# Patient Record
Sex: Male | Born: 1965 | Hispanic: Refuse to answer
Health system: Western US, Academic
[De-identification: ages and names within clinical notes are randomized; demographics above are authoritative.]

## PROBLEM LIST (undated history)

## (undated) DIAGNOSIS — T7840XA Allergy, unspecified, initial encounter: Secondary | ICD-10-CM

## (undated) HISTORY — DX: Allergy, unspecified, initial encounter: T78.40XA

## (undated) NOTE — ED Notes (Signed)
Formatting of this note might be different from the original.  Patient was given discharge instructions, verbalized knowledge and understanding of same.  Patient was discharged to home without complaint, appeared in no acute distress at this time.    Vitaly Mariana Arn, RN  Electronically signed by Ocie Cornfield, RN at 07/15/2023  1:46 PM PDT

## (undated) NOTE — Progress Notes (Signed)
 Formatting of this note might be different from the original.  HomeCare Document    Electronically signed by Shaune Leeks, MD at 08/10/2023 10:19 AM PDT

## (undated) NOTE — Telephone Encounter (Signed)
Signed Prescriptions: Disp Refills   methylphenidate HCl (RITALIN) 20 mg tablet 84 tab*0   Sig: Take 0.5-1 tablets (10-20 mg) by mouth 2 to 3 times   daily FOR DEPRESSION (LIMIT USE AS TOLERATED), TO   LAST 28 days  Authorizing Provider: Stevphen Rochester    -------------------------------------------------------------------    Electronically signed by Stevphen Rochester, MD at 05/19/2023  6:41 AM PDT

## (undated) NOTE — Progress Notes (Signed)
Formatting of this note is different from the original.  Images from the original note were not included.  RN FOLLOW UP  -  CHRONIC DISEASE MANAGEMENT    [x]  CDM Rn confirmed patient full name and date of birth.     HISTORY  Carlos Hancock is a 63 year old male who has type 2 diabetes.    Goals and targets documented in RN assessment.    Summary of Plan from last contact:   CDM Rn has asked patient to begin checking fasting, pre-dinner and before bed.      Lab Results   Component Value Date    A1C 8.1 (H) 02/23/2023    A1C 8.2 (H) 12/11/2022    A1C 9.0 (H) 08/22/2022     Lab Results   Component Value Date    GFR 100 10/20/2022     Nursing Assessment:  Any items in red indicated areas of concern and education was provided unless otherwise noted in the plan.     Prescribed DM Orals:   metFORMIN (GLUCOPHAGE XR) 500 mg extended release (24 hr) tablet - Take 4 tablets (2,000 mg) by mouth daily  empagliflozin (JARDIANCE) 25 mg tablet - Take 0.5 tablets (12.5 mg) by mouth every morning     Prescribed DM Non-insulin Injectables: N/A    Prescribed Insulin:       Insulin Type Basal Dose  Morning Basal Dose  Evening/HS Estimated Total Daily Dose  (All basal & all bolus doses)   Prescribed Doses Semglee  Vial 30 units  - 30 units        Mealtime (bolus) insulin:  Base dose or Carb ratio (ICR): N/A   Sliding Correction Scale (ISF): N/A     Titration parameters include: RN confirms correct titration parameters/medications are ordered in CDM Referral including Nursing Diabetes Medication Management of Type 2 Standing Order     Concerns about medication or insulin adherence: No    Monitoring:   Patient is synced with [employer] and patients blood glucose data is pasted below     Glucose concerns:    Yes; hyperglycemia    Patient is above glucose goal(s) fasting, pre-dinner and before bed.     Glucose Goals:  Fasting: 80-130 mg/dL  Pre-Meals: 57-846 mg/dL  Before Bed: <962 mg/dL   2 Hours After Eating: <180 mg/dL   Overall Glucose  Average Goal: 80-150 mg/dL     CDM Rn to increase morning dose of Semglee to hyperglycemia.    CDM Rn discussed the pattern of prandial rise between meals and explained that this is most often directly related to nutritional intake. CDM Rn explained that there are a few options we may consider right now for addressing this:    Working on carbohydrate moderation with dinner and after dinner snack combined with increasing protein and vegetable intake in attempt to reduce prandial rise.  Adding in a mealtime insulin called Humalog that will provide insulin coverage for food in attempt to reduce prandial rise.    CDM Rn explained that neither choice is "better" than the other and it ultimately comes down to what is best for the patient. Additional information sent to patient through secure message.    Patient asks if switching Metformin from bedtime to morning could be causing elevated morning numbers. CDM Rn explained that because patient is on XR formula this is unlikely and that elevated morning numbers are more likely the result of having discontinued evening dose Semglee.    Blood pressure:  Current Medications:   atorvastatin (LIPITOR) 20 mg tablet - Take 1 tablet (20 mg) by mouth daily   Blood Pressure Clinic   02/11/2023 152/99    149/99   12/11/2022 152/82   09/10/2022 142/80    150/84   07/10/2022 140/90   03/24/2022 146/90       Patient in target:  yes  Is RN managing HTN: RN will not manage patients BP, it will continue to be managed by PCP    Reducing Risks:   Health Maintenance Due   Topic Date Due    Vaccine: COVID-19 (1) Never done    Vaccine: Shingles (1 of 2) Never done    Blood Pressure Check  02/12/2023    CRCS: FIT (Home stool test)  04/04/2023    Depression F/U: [facility] Monitoring Tool  07/24/2023         03/26/2021     4:00 PM 02/07/2022     1:00 PM 03/25/2022     1:00 PM 05/26/2022     5:29 PM 09/10/2022    10:12 AM 12/11/2022     2:00 PM 03/24/2023     9:00 AM   BHI Summary   PHQ-9 Score 15 11 12  10    10 5 9 7    PHQ-9 Score (MyGH)    10 5     GAD-2 Score 4 2  2    2 2 2 2    Audit-C Score (Monitoring Tool) 0 0  0    0 0 0 0   Audit-C Score (BHI Screen)   0       Audit-C Score (MyGH)    0 0     Marijuana Use (Monitoring Tool) 0 0  0 0 0 0   Marijuana Use (BHI Screen)   0       Marijuana Use [facility])    Never Never     Drug Use (Monitoring Tool) 0 0  0 0 0 0   Drug Use (BHI Screen)   0       Drug Use [facility])    Never Never     Access to guns? (Monitoring Tool) 1 1  1 1 1 1    Access to guns? [facility])    Yes Yes         Social History     Tobacco Use    Smoking status: Never    Smokeless tobacco: Never   Substance Use Topics    Alcohol use: No    Drug use: No       PLAN:  Updated insulin prescriptions today:  yes      CDM Rn Actions:  Update patient prescription(s) in Epic    Patient Actions:  We are increasing your morning dose of Semglee from 30 units to 35 units.  This is a 18% increase and is being done to target daily hyperglycemia.         Insulin Type Basal Dose  Morning Basal Dose  Evening/HS Estimated Total Daily Dose  (All basal & all bolus doses)   Prescribed Doses Semglee  Vial 35 units  - 35 units        Our next phone visit is scheduled for: 05/26/23 at 10:00am    Amy Hanson-Murillo  Diabetes Nurse  Population Nursing Management    Electronically signed by Salena Saner, RN at 05/05/2023 11:10 AM PDT
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
## (undated) NOTE — Progress Notes (Signed)
Formatting of this note might be different from the original.  Hi Dr. Raleigh Callas:    Will you please reply "I agree" if you approve of my implementing the Population Care Nursing and Diabetes standing order?    Nursing Diabetes Medication Management of Type 2 Standing Order    Thank you!    Amy Hanson-Murillo  Diabetes Nurse  Population Nursing Management   Electronically signed by Salena Saner, RN at 06/10/2023  1:04 PM PDT

## (undated) NOTE — Progress Notes (Signed)
Formatting of this note is different from the original.  Provider needs to reconcile: Medications     Patient's main concern: Hand Problem     Other needs or issues hoping to address?   A1C Blood Test    Care reminders were reviewed:  Yes:  Flu    Chaperone for Sensitive Physical Exams (In Office):  Not Applicable    Orders Placed This Encounter    ibuprofen (MOTRIN) 600 mg tablet    acetaminophen (TYLENOL) 500 mg tablet     eCheck-In Action/Status       eCheck-in Steps Patient Action Taken Status    Personal Information  Not Started    Payments  Not Needed    ESign Documents  Not Offered    Questionnaires  Not Started    Medications  Not Started    Allergies  Not Started    Barcode  Not Started         Assigned Appointment Questionnaires and Status       Assigned Questionnaires Status    REASON FOR VISIT SELECTION [facility]) Assigned           Medications reviewed:       Thu Dec 11, 2022  1:20 PM       Allergies reviewed:         Dec 11, 2022  1:17 PM       Questionnaires completed: No questionnaires completed  Smoking Tobacco Use: Never    Marcellus Scott, [place]  12/11/2022, 1:21 PM    Electronically signed by Marcellus Scott, MA at 12/11/2022  3:26 PM PST

## (undated) NOTE — Progress Notes (Signed)
 Formatting of this note might be different from the original.  HomeCare Document    Electronically signed by Shaune Leeks, MD at 08/05/2023  1:19 PM PDT

## (undated) NOTE — Progress Notes (Signed)
Formatting of this note might be different from the original.  Attempted to contact the patient via phone to complete pre-visit prep. Left message with appointment reminder, if active on My Chart please sign-in to your [facility] Chart and complete your eCheck-in, you do not need to call us back.    Electronically signed by Marshell Levan, [place] at 10/08/2023  1:07 PM PDT
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
## (undated) NOTE — Progress Notes (Signed)
Formatting of this note might be different from the original.  Called & briefly spoke w/patient - this visit was schedule as PV. Plan was to do VV for assessment of cervical spine. VV scheduled for next week due to time constraints today.   Electronically signed by Aron Baba, PT at 06/02/2023 11:21 AM PDT

## (undated) NOTE — Assessment & Plan Note (Signed)
Associated Problem(s): Type 2 diabetes mellitus with hyperglycemia, with long-term current use of insulin  Formatting of this note might be different from the original.  Recent A1C of 8.4, up from 8.1-8.2.   Currently on 45 units of long-acting insulin, metformin, and Jardiance. Morning blood sugars between 120-150 as per pt.  -Continue current regimen.   He was on short acting insulin in the hospital and has restarted his oral meds.   -Check A1C in 1 month to assess need for short-acting insulin.    Electronically signed by Shaune Leeks, MD at 07/07/2023 12:47 PM PDT

## (undated) NOTE — Progress Notes (Signed)
Formatting of this note might be different from the original.  Attempted to contact the patient via phone to complete pre-visit prep. SM sent with reminder to complete E-CHECK IN.    Electronically signed by Marshell Levan, [place] at 07/07/2023 12:47 PM PDT

## (undated) NOTE — Progress Notes (Signed)
Formatting of this note might be different from the original.  Please see transcribed notes for additional visit information.    Electronically signed by Jerolyn Center, MA at 11/16/2009  8:10 AM PST

## (undated) NOTE — Progress Notes (Signed)
Formatting of this note might be different from the original.  Please see transcribed notes for additional visit information.    Electronically signed by Mancel Parsons, LPN at 16/60/6301  9:04 AM PDT

## (undated) NOTE — Progress Notes (Signed)
Formatting of this note is different from the original.  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 He is s/p C4/5 cervical fusion in the 1994. He has trialed all of typical medication classes for his pain complaints including SNRI's (currently on duloxetine), gabapentinoids (on gabapentin), TCA's (nortriptyline), and sodium channel blockers (carbamazepine, lamotrigine) and I do not recommend retrials of these medications at this time.  We could consider increasing his duloxetine to 90 mg per day however I defer this to his mental health provider, Dr. Lavella Hammock. I will reach out to her for her input.  Other potential medication options include low-dose naltrexone and memantine.  However, these medications are typically prescribed for CNS sensitized disease states like chronic regional pain syndrome and fibromyalgia.  His last central sensitization inventory was calculated in 03/2021 which was indicative of mild central sensitization. I do not believe that trials of low-dose naltrexone or memantine would be beneficial in this setting.  His current opioid dose is considered high (>90 mg). When this is taken into consideration along with his underlying medical comorbidities, they are at moderate-to-high risk for opioid related morbidity and mortality.  He is not shown any signs or symptoms of opioid related toxicities up to this point.  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 He receives significant functional benefit from his opioids. Based on his ORT score of 1, the patient is at low risk for opioid related aberrant behaviors. Based previous comprehensive evaluations, I do not believe that the patient is misusing his opioids and uses them appropriately for functional benefit.      He continues to  tolerate his current opioid regimen well without any side-effects or safety concerns. In a shared decision, will continue oxycodone 15 mg max of 5.5 tabs/day with close monitoring for worsening pain/reduced function.     PHARMACOTHERAPY RECOMMENDATIONS:  -Continue Oxycontin 10 mg BID, oxycodone 15 mg to a max of 5.5 tabs/day  -Phone PharmD follow up in 4 weeks.   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 He is s/p C4/5 cervical fusion in the 1994.     Last Pain PharmD Evaluation on 04/27/2023:  He continues to tolerate his current opioid regimen well without any side-effects or safety concerns. In a shared decision, will reduce his oxycodone 15 mg to a max of 5.5 tabs/day with close monitoring for worsening pain/reduced function.     Interval History:   The patient's primary pain complaint continues to located in his neck, bilateral shoulders, bilateral feet, RUE  He continues on Oxycontin 10 mg to BID and reduced oxycodone 15 mg to a max of 5.5 tabs/day without any side-effects or safety concerns. He reports worsening pain.     Current Pain/Psychoactive Medication Regimen:  Medication How it is prescribed Reported use   Oxycodone ER 10mg  1 tab BID  As prescribed   Oxycodone 15 mg 1 tab Q4H PRN  As prescribed (average 5.5 tabs/day)   Ibuprofen 200   OTC Intermittent use (about once/week)   The patients current MED is 153.75 mg  Allergies   Allergen Reactions    Adhesive Tape Rash and Dermatitis     Welts, skin tear    Naproxen Swelling     Renal:  Lab Results   Component Value Date    CRE 1.42 04/22/2021     Lab Results   Component Value Date    GFR 58 04/22/2021    GFR above 60 03/04/2021     Hepatic:  Lab Results   Component Value Date    ALT 18 04/22/2021     Lab Results   Component Value Date    AST  10 04/22/2021     Opioid risk assessment:  Opioid Risk Stratification: high  RIOSORD score: 34 (history of migraine, prescribed long acting opioid formulation, prescribed benzodiazepine, prescribed antidepressant ), which correlates to average predicted probability 55.1 % of opioid-induced respiratory depression during the next 6 months. (Zedler et al, 2018). The RIOSORD is the Risk Index for Overdose or Serious Opioid-Induced Respiratory Depression tool for identifying patients at risk for opioid induced respiratory depression. It has been validated in both [place] Veterans and in the general population and can be used to decide which patients may benefit from take home naloxone. Noted patient was prescribed naloxone and dispensed on (03/22/2020)  UDS (02/11/2023): Consistent with prescribed regimen.   [place] PDMP (1-year trend) : Appropriate  Medical co-morbidity risks:history of migraine, history of recurrent major depression resistant to treatment, history of TIA.    Izora Gala, PharmD  Clinical Pharmacy Specialist - Pain Management    Electronically signed by Jonny Ruiz, PharmD at 05/25/2023 11:51 AM PDT
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
## (undated) NOTE — Discharge Summary (Signed)
 Formatting of this note might be different from the original.  HOME HEALTH DISCHARGE SUMMARY    RECOMMENDED FOLLOW UP PLAN / FUTURE APPT: recommend outpatient PT and referral  to physiatry  CURRENT LEVEL OF FUNCTION / SOCIAL SUPPORT: pt resides in own 2 level home with  teenage children. pt is indep with all adls and mobility, using a cane for  stairs and outside ambulation. pt continues to have pain in multiple sites,  bilat shoulders, L>R, L knee, sternum and ribs. pt is awaiting some diagnostic  imaging regarding these areas. pt is able to manage and drive and therefor is  no longer homebound. ready for HHPT DC    MOST RECENT STANDARD TESTING RESULT (if appropriate) - NA    DISCHARGE OUTCOMES -   1. Indep ambulation and stair management.-met  2. Improve general mobility with TUG test score of <17 sec.-NA  3. Complete safe DC planning. -met    SIGNIFICANT BARRIERS pain  Electronically signed by Buddy Duty, PT at 08/19/2023  9:38 AM PDT
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
## (undated) NOTE — Progress Notes (Signed)
Formatting of this note is different from the original.  Medicare Part D - Medication Therapy Management Program:  Comprehensive Medication Review - [facility]    ** Please review the following recommendation(s) and reply. If you agree, please state agreement (i.e. I agree with plan) with any additional comments and route back to clinical pharmacist Pedro Earls, PharmD). Clinical Pharmacist to implement recommendation(s)**    ACTION ITEM(S) FOR CONSIDERATION   None: No Recommendation    CLINICAL PHARMACIST REVIEW     Verified and updated allergies/intolerances?:  No    Drug-Drug Interaction review completed by pharmacist: Yes    Patient medication concerns:   Needs refills: testosterone, atorvastatin, Jardiance, insulin syringes, lamotrigine  Routed request to mail order pharmacy  Needs OTC medications, but is cost sensitive. Was not aware of his OTC benefit through Medicare.  Sent secure message to pt with instructions.  Pt states that doctor at hospital was concerned about over basalization with glargine insulin.  Advised pt to speak with population RN, but to also hold off on diabetes regimen adjustments until he has stabilized some.    Medication care gaps:  Identified medication(s) on [employer] Preferred / [employer] Initiatives:  no  Identified medication(s) on High Risk Medication in the elderly list:  not applicable  On statin for ASCVD/diabetes: Yes    Prescription Days Covered (PDC) review findings:  Adherent: PDC > 80%    Adherence:  Many people find it challenging to take medication as prescribed. Which medications have you had missed doses or stopped taking, perhaps because you didn't think it was working or because of how it made you feel? None  What system is in place for remembering which medications to take and when? (If no system in place or the current one is not working, discuss options to prevent adherence issues) Part of his routine, keeping well up to date with current meds and recent changes from hospital  stay.    Labs/vitals:    CREATININE   Date Value Ref Range Status   10/20/2022 0.90 0.50 - 1.30 mg/dL Final   16/09/9603 5.40 0.50 - 1.30 mg/dL Final   98/10/9146 8.29 0.50 - 1.30 mg/dL Final     Lab Results   Component Value Date    GFR 100 10/20/2022    GFR 88 08/22/2022    GFR 88 02/07/2022     BUN   Date Value Ref Range Status   10/20/2022 15 6 - 24 mg/dL Final   56/21/3086 14 6 - 24 mg/dL Final   57/84/6962 15 6 - 24 mg/dL Final     SODIUM   Date Value Ref Range Status   10/20/2022 141 135 - 145 MEQ/L Final   08/22/2022 138 135 - 145 MEQ/L Final   02/07/2022 140 135 - 145 MEQ/L Final     POTASSIUM   Date Value Ref Range Status   10/20/2022 4.3 3.5 - 5.2 MEQ/L Final   08/22/2022 4.2 3.5 - 5.2 MEQ/L Final   02/07/2022 5.1 3.5 - 5.2 MEQ/L Final     AST   Date Value Ref Range Status   10/20/2022 12 6 - 35 U/L Final   02/07/2022 11 6 - 35 U/L Final   04/22/2021 10 6 - 35 U/L Final     ALT   Date Value Ref Range Status   10/20/2022 29 5 - 50 U/L Final   02/07/2022 20 5 - 50 U/L Final   04/22/2021 18 5 - 50 U/L Final  Lab Results   Component Value Date    CHOL 142 03/24/2022    CHOL 163 01/28/2021    CHOL 101 05/11/2019     Lab Results   Component Value Date    HDL 47 03/24/2022    HDL 49 01/28/2021    HDL 38 (L) 05/11/2019     Lab Results   Component Value Date    LDL 52 03/24/2022    LDL 56 01/28/2021    LDL 35 05/11/2019     Lab Results   Component Value Date    TRIG 217 03/24/2022    TRIG 291 (H) 01/28/2021    TRIG 141 05/11/2019     Patient Reported BP:       02/11/2023    11:04 AM 02/11/2023    10:59 AM 12/11/2022     1:21 PM 09/10/2022    10:08 AM 09/10/2022    10:05 AM 07/10/2022    10:45 AM 03/24/2022    11:16 AM   Blood Pressure   Clinic BP 152/99 149/99 152/82 142/80 150/84 140/90 146/90     Blood Pressure Clinic   02/11/2023 152/99    149/99   12/11/2022 152/82   09/10/2022 142/80    150/84   07/10/2022 140/90   03/24/2022 146/90       Pulse Readings from Last 3 Encounters:   02/11/23 115   12/11/22 106   09/10/22  98     The 10-year ASCVD risk score (Arnett DK, et al., 2019) is: 12.3%    Values used to calculate the score:      Age: 82 years      Sex: Male      Is Non-Hispanic African American: No      Diabetic: Yes      Tobacco smoker: No      Systolic Blood Pressure: 152 mmHg      Is BP treated: No      HDL Cholesterol: 47 mg/dL      Total Cholesterol: 142 mg/dL    Health Maintenance alerts:  Health Maintenance Due   Topic Date Due    Vaccine: COVID-19 (1) Never done    Vaccine: Shingles (1 of 2) Never done    Blood Pressure Check  02/12/2023    CRCS: FIT (Home stool test)  04/04/2023    COT CARE PLAN UPDATE  06/30/2023    DM Foot Exam  07/11/2023    Depression F/U: [facility] Monitoring Tool  07/24/2023     Next scheduled appointment:     Future Appointments   Date Time Provider Department Center   07/01/2023  9:00 AM Phibbs, Tomie China, MD [facility] CONSULTATION [address]   07/06/2023 10:50 AM Shaune Leeks, MD [facility] [place]   07/07/2023 12:40 PM Salena Saner, RN POPULATION NURSE MANAGEMENT [place]   07/13/2023  2:50 PM Shaune Leeks, MD [facility] [place]   08/20/2023 10:00 AM Jonny Ruiz, PharmD [facility] PA[facility] CARE CONSULTATION [address]     PATIENT ACTION PLAN   1. What we talked about: Taking medication as prescribed- It sounds like you have a great system in place for keeping up to date with your medications and the recent changes after being in the hospital.  What I need to do: Keep up the great work!     2. What we talked about: Over-the-counter product(s)- Medicare may cover some of your OTC products.  What I need to do: Visit [email or web address] to  see an Stage manager and order products. You may also have a catalog sent to you and place an order by calling [phone] (Monday through Friday, 7AM to 5PM).     3. What we talked about: Medication change(s)- You noted that the doctor at the hospital raised concern about your insulin regimen and that there may be  opportunity to optimize your regimen with a mealtime insulin.  What I need to do: Please follow-up with your Population Health Nurse to discuss possible changes to your regimen. It may be worthwhile to wait a few weeks as you recover and then make changes together.     4. What we talked about: Reminder to refill or pickup medication- You reported needing 5 medications refilled at the pharmacy. I routed a message to the pharmacy to get these coordinated for you. What I need to do: Please keep an eye out for these to arrive soon.     CMR recipient requests post-CMR letter to be delivered via -: Mail   Patient's address verified: Yes  Name of CMR recipient: Masao Junker  [address]  [place] [id number]    Pedro Earls, PharmD  Clinical Pharmacist   -------------------------------------------------------------------------------------------------------------------  Administration information for Medication Therapy Management  Comprehensive Medication Review was offered to, and accepted by the patient on 06/30/2023.  Method of delivery: Phone  Recipient: Beneficiary  Provider: Plan Sponsor Pharmacist          Electronically signed by Pedro Earls, PharmD at 07/02/2023 11:09 AM PDT
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
## (undated) NOTE — Telephone Encounter (Signed)
Pending Prescriptions: Disp Refills   methylphenidate HCl (RITALIN) 20 mg tablet 84 tab*0   Sig: Take 0.5-1 tablets (10-20 mg) by mouth 2 to 3 times   daily FOR DEPRESSION (LIMIT USE AS TOLERATED), TO   LAST 28 days    -------------------------------------------------------------------    Electronically signed by Lovell Sheehan, PhT at 05/18/2023  5:59 AM PDT

## (undated) NOTE — Progress Notes (Signed)
 Formatting of this note might be different from the original.  HomeCare Document    Electronically signed by Shaune Leeks, MD at 07/07/2023  3:01 PM PDT

## (undated) NOTE — Assessment & Plan Note (Signed)
Associated Problem(s): Controlled type 2 diabetes mellitus with diabetic neuropathy, with long-term current use of insulin  Formatting of this note might be different from the original.  Improved A1C, currently managed with insulin and oral medications.  -Continue current regimen.  -Order A1C in three months.    Electronically signed by Shaune Leeks, MD at 08/30/2023  4:30 PM PDT
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
## (undated) NOTE — Progress Notes (Signed)
Formatting of this note might be different from the original.  Carlos Hancock seen in clinic following elevated blood pressure reading in lobby, name and DOB verified.  Lobby BP: 172/174mmHg HR 125bpm    BP in clinic: 149/94mmHg HR 117bpm  Rechecked ~8 minutes later: 152/39mmHg HR 115bpm  Of note elevated HR not new for patient as he is on Ritalin.    Patients repeat BP was elevated today >140/90. Pt screened for symptoms below.     Does the patient have any cardiac related symptoms? (Shortness of breath or chest, neck, jaw, shoulder or arm pain or discomfort) No  Lightheadedness? No  Blurry Vision? No  Headache? No  New Onset of difficulty standing/walking? No  New Onset weakness of face/unilateral extremity, confusion, decreased mental status?  No  Currently pregnant or within 12 weeks post partum? N/A  Did the patient answer yes to any above question or their BP is >180/110? No    Patient feels his elevated BP may be related to being in clinic feeling nervous. He was advised to purchase Omron at home cuff to determine if BP remains elevated. CU-10 visit scheduled 02/20/23 with PCP and [name] will purchase blood pressure cuff and log readings for at least 5 days twice daily. He was instructed to send PCP secure message of log to help with decision making around prescribing and voiced agreement. Also informed that CU-10 slot will be focused specifically on blood pressure medication.    He is aware of s/s warranting higher level of care such as UC/ED and will follow up should concerns arise.    Electronically signed by Wayland Denis, RN at 02/11/2023 11:20 AM PST

## (undated) NOTE — Telephone Encounter (Signed)
Formatting of this note might be different from the original.  Patient called again in regards to his pain medications.  Last week mail order did not process it in time that the patient would receive it in time for his 1030am dose. I had called to have his prescriptions changed to the [facility] pharmacy but they did not have the stock.  Today patient called again as [facility] would again have to order his meds and they would not be available in time.  I called [facility] pharmacy and asked if they had the stock for his medications and they did.  They will call [facility] to transfer the rx's.  Pt notified.  Electronically signed by Nolon Stalls L at 07/17/2023 11:39 AM PDT

## (undated) NOTE — Progress Notes (Signed)
Formatting of this note is different from the original.  Images from the original note were not included.   [facility] VIDEO Visit  09/30/22    Due to emergent measures put in place by our national and state government to help stop the spread of the COVID-19 pandemic crisis, and to protect this patient from the possible risk of transmission in a clinic setting, this virtual visit was conducted in place of an office visit.  Patient was identified with two identifiers.  Patient is located at in parking at [facility] in his car and the practitioner is located in [facility] clinic.           Appointment Notes       COT visit          Primary Care Manager  Shaune Leeks, MD    CHIEF COMPLAINT    chief complaint:  COT Visit    Subjective:   Carlos Hancock is a 66 year old male who presents for chronic opioid therapy management for the treatment of Neck pain  Shoulder pain.  chronic neck and shoulder pain    Symptoms are worse in the winter.   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     Functional goal: Improve walking ability, Improve sleep, and Improve ability to engage in activities (cook, clean, garden, etc.)   Achieved functional goals:  Yes.  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  Patient perceived benefits of COT are: Improve relationships  Improve ability to engage in activities (cook, clean, garden, etc.)  Current opioid side effects: None  Last dose of opiate medication: 6 hours - is overdue for a dose, but has to drive home after picking up antibiotics today  Additional concerns for today: No    Current Opioid Medications and MEDD:  Outpatient Morphine Milligram Equivalents Per Day        09/30/22 - 10/13/22 180 MME/Day      Order Name Dose Route Frequency Maximum MME/Day     oxyCODONE (OXYCONTIN) 10 mg extended release (12 hr) tablet 10 mg Oral 3 Times Daily 45 MME/Day     oxyCODONE (ROXICODONE) 15 mg tablet 15 mg Oral Every 4  Hours PRN 135 MME/Day    Total Potential Morphine Milligram Equivalents Per Day 180 MME/Day      Calculation Information         oxyCODONE (OXYCONTIN) 10 mg extended release (12 hr) tablet    oxyCODONE 10 mg Tr12: single dose of 10 mg * 3 doses per day * morphine equivalence factor of 1.5 = 45 MME/Day      oxyCODONE (ROXICODONE) 15 mg tablet    oxyCODONE 15 mg Tab: single dose of 15 mg * 6 doses per day * morphine equivalence factor of 1.5 = 135 MME/Day                      10/14/22 and after None               Current Non-Opioid Pain Medications:  ibuprofen (for dental surgery)  Naloxone last ordered on 07/21/2022    PEG (required):  Clinically meaningful improvement in function is defined as >= 30% reduction in PEG score as compared to start of treatment or response to dose change 05/26/2022 05/26/2022 09/30/2022   Pain in last month (0-10): (Wellness) - - -   Pain interference with daily activities (0-10): (Wellness) - - -  Health in general: (Wellness) - - -   Pain 7 7 8    Quality of Life 8 8 5    Function 8 8 4    PEG Score 7.67 7.67 5.67   Pain ([facility]) - 7 -   Quality of Life [facility]) - 8 -   Function [facility]) - 8 -   PEG Score [facility]) - 7.67 -       Improvements in PEG he links to doing better with depression and his participation in coaching middle school  Sports    Safety Checks:  Last Care Plan Update: 05/27/2022  [place]MP Review:   [employer] PMP Findings: consistent with prescribed medications   UDS:   OPIATES   Date Value Ref Range Status   08/22/2022 NONE DETECTED CUT-OFF: 300 ng/mL Final     Patient expected UDS findings: for prescribed medications (next due in December)    Screening Questionnaires:  ORT:   Opioid Risk Tool (ORT) 02/29/2016 03/26/2021   FamHx: Alcohol - 0   FamHx: Illegal Drugs - 0   FamHx: Rx Drugs - 0   Personal Hx: Alcohol - 0   Personal Hx: Illegal Drugs - 0   Personal Hx: Rx Drugs - 0   Between 75 and 21 years old? - 0   Psych: ADHD/ADD, OCD, Bipolar, Schizophrenia - 0   Psych: Depression 1 0    Risk score - 0   Risk category - Low     BHI Summary:   BHI Summary 03/18/2021 03/26/2021 02/07/2022 03/25/2022 05/26/2022 05/26/2022 09/10/2022   PHQ-9 Score 15 15 11 12 10 10 5    PHQ-9 Group Score - - - - - - -   PHQ-9 Score ([facility]) 15 - - - - 10 5   GAD-2 Score 5 4 2  - 2 2 2    GAD-2 Group Score - - - - - - -   Audit-C Score (Monitoring Tool) 0 0 0 - 0 0 0   Audit-C Score (BHI Screen) - - - 0 - - -   Audit-C Score ([facility]) 0 - - - - 0 0   Marijuana Use (Monitoring Tool) 0 0 0 - 0 0 0   Marijuana Use (BHI Screen) - - - 0 - - -   Marijuana Use ([facility]) Never - - - - Never Never   Drug Use (Monitoring Tool) 0 0 0 - 0 0 0   Drug Use (BHI Screen) - - - 0 - - -   Drug Use [facility]) Never - - - - Never Never   Access to guns? (Monitoring Tool) 1 1 1  - 1 1 1    Access to guns? [facility]) Yes - - - - Yes Yes     Risk Level:   Patient's specific condition risk factors are:  Mental Health             Noted    Recurrent major depression resistant to treatment 05/27/2013    Overview     Last F2F visit with Dr. Lavella Hammock 11/01/18.    Previous psychiatrist Dr. 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    Overview     Incident To KP last  OV date:  01/01/21 for testosterone therapy  Specialist in charge: Seaver  Testosterone cypionate 150mg  every week          Aberrant behaviors: none  System calculated risk: High Intensity  Provider override risk:      Active Problems/Past Medical History:  Patient Active Problem List   Diagnosis    Right shoulder pain    Cervical vertebral fusion    Personal history of TIA (transient ischemic attack)    Sleep disorder    Ocular migraine    CHRONIC OPIOID THERAPY CARE PLAN    Deviated nasal septum    Type 2 diabetes mellitus with peripheral neuropathy    Encounter for long-term (current) use of insulin    Recurrent major depression resistant to treatment    Hypogonadotropic  hypogonadism    Personal history of COVID-19    Long COVID    Type 2 diabetes mellitus with hyperglycemia, with long-term current use of insulin    Chronic pain     Medications/Allergies:  No Active Medications on file as of 09/30/2022.    Allergies   Allergen Reactions    Adhesive Tape Rash and Dermatitis     Welts, skin tear    Naproxen Swelling     Social History:  Social History     Tobacco Use    Smoking status: Never    Smokeless tobacco: Never   Substance Use Topics    Alcohol use: No       Physical Exam: (Video)  Gen: Alert, oriented.  Pleasant.  Psych: Appearance: well groomed  Behavior: No psychomotor retardation or agitation  Mood: Euthymic  Affect: Appropriate and slightly blunted/restricted  Speech: Normal rate, volume, tone (softer tone due to recent oral surgery and mouth pain)  Thought: No SI/HI/Psychosis  Cognition: Grossly intact.  Suicidal ideations: none   Skin: no rashes lesions appreciated on visible skin at available video quality    A/P:     (M25.511) Right shoulder pain  (primary encounter diagnosis)    (G89.29) Chronic pain    (F11.90) Chronic, continuous use of opioids  Comment: tapering gradually with Kandee Keen due to high MED  - PEGs are improving  Plan: continue current tapering plan with PharmD    Opioid Medication Plan:  Taper:  Medication being tapered: Oxycodone 15 mg and Oxycodone ER 10 mg  Rate of taper: Slowest (ie many years, high risk but no evidence of current problems): 2% every 4 weeks (follow up monthly)  Chronic Opioid Therapy (COT) Safety Guideline - [place]   Clinician managing taper: Shaune Leeks, MD  Is patient managed by [facility](Clinical Pharmacist)?: Yes - Jonny Ruiz, PharmD    Non-Medication Treatment Plan:  1. Movement and body awareness (first line for all chronic pain: walking and other low impact activity  2. Pain management skill building:  would like to do massage therapy but it is not covered by Medicare insurance  3. Complementary/integrative treatment:  none  4. Other: heat and ice    Risks of opioids for all patients are dependence, opioid use disorder, respiratory depression, cognitive effects and overdose. Patient and I decided that the benefits of opioids outweigh the risks for their individual needs.      Patient is given written updated opioid care plan AVS - printed and mailed today    Follow-up in 3 months with office or video visit with me, for COT visit and with usual follow-ups with Dr. Julio Alm PharmD    UDS monitoring will be based  on risk level as above per KP guidelines.     This Video Visit consisted of 20 minutes of interaction with the patient.       The patient or representative indicates understanding of these issues and agrees with the plan.     Tomie China Phibbs, MD    NOTE TO PATIENT: The 21st Century Cures Act makes medical notes like these available to patients in the interest of transparency. However, be advised this is a medical document. It is intended as peer to peer communication. It is written in medical language and may contain abbreviations or verbiage that are unfamiliar. It may appear blunt or direct. Medical documents are intended to carry relevant information, facts as evident, and the clinical opinion of the practitioner(s).  Electronically signed by Phibbs, Tomie China, MD at 09/30/2022 11:01 AM PDT

## (undated) NOTE — Assessment & Plan Note (Signed)
Associated Problem(s): Right shoulder pain  Formatting of this note might be different from the original.  Chronic right shoulder pain and knee pain.   Ortho already ordered MRI. Scheduled for 10/20  Advised follow up with ortho once MRI results available  Electronically signed by Shaune Leeks, MD at 10/08/2023  1:04 PM PDT

## (undated) NOTE — Progress Notes (Signed)
 Formatting of this note might be different from the original.  HomeCare Document    Electronically signed by Shaune Leeks, MD at 08/05/2023  4:54 PM PDT

## (undated) NOTE — Assessment & Plan Note (Signed)
 Associated Problem(s): Cervical radiculopathy  Formatting of this note might be different from the original.  New onset cervical radiculopathy S/p recent CVA  History of cervical fusion at C4-5  [facility].   Electronically signed by Shaune Leeks, MD at 10/08/2023  1:03 PM PDT

## (undated) NOTE — Telephone Encounter (Signed)
Signed Prescriptions: Disp Refills   blood glucose test strips (ONETOUCH VERIO) 400 ea*3   Sig: Use as directed to test blood sugars 3 to 4 times   daily  Authorizing Provider: Shaune Leeks  Ordering User: Danley Danker    -------------------------------------------------------------------    Electronically signed by Danley Danker, RPh at 08/17/2023  7:29 AM PDT

## (undated) NOTE — Assessment & Plan Note (Signed)
Associated Problem(s): Onychomycosis  Formatting of this note might be different from the original.  Recurrent fungal infection under the right big toenail. Previously treated effectively with oral antifungal medication.  -Defer treatment with oral antifungal medication due to recent trauma   -Reevaluate for potential treatment a few weeks post-discharge.    Electronically signed by Shaune Leeks, MD at 06/29/2023  3:20 AM PDT

## (undated) NOTE — Telephone Encounter (Signed)
Formatting of this note might be different from the original.  Refill request approved per Prescription Renewal Protocol.     Electronically signed by Danley Danker, RPh at 08/17/2023  7:29 AM PDT

## (undated) NOTE — Progress Notes (Signed)
Formatting of this note might be different from the original.  Letters sent to patient via mail.     -------------------------------------------------------------------------------------------------------------------  Administration information for Medication Therapy Management  Comprehensive Medication Review was offered to, and accepted by the patient on 06/30/2023.  Method of delivery: Phone  Recipient: Beneficiary  Provider: Plan Sponsor Pharmacist  Letters sent: 07/02/2023          Electronically signed by Brigitte Pulse, PhT at 07/02/2023 11:09 AM PDT
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
## (undated) NOTE — Discharge Summary (Signed)
 Formatting of this note might be different from the original.  HH/PA DISCHARGE SUMMARY    - had video visit with pain MD on 08/04/23, beginning 08/21/23 patient can pick  up oxycontin 10mg  every 8 hours, he is using oxycodone 15mg  every 4 hours  - seen by orthopedics, MRI of left shoulder and left knee ordered along with  MRI of neck, patient has an appointment for cervical MRI on 08/21/23 and has  been instructed to call radiology to see if all 3 MRI can be done on the same  day  - using a brace around chest, alleviates some pain  - called 911 a few days ago at night due to waking up and being SOB, he reports  that he was evaluated by EMT but did not go into UC/ER  - continues to have numbness in his left 4th and 5th fingers  - reports that blood sugars have been in the low to middle 100s  - discipline DC done    DISCHARGE DATE - 08/12/23    _x__ DISCIPLINE DISCHARGE: SN  ___ EPISODE DISCHARGE: SN    RECOMMENDED FOLLOW UP PLAN - MRI of neck, shoulder, knee, f/u with PCP, pain MD  and orthopedics    CURRENT LEVEL OF FUNCTION / SOCIAL SUPPORT - Living in a home with 2 children.   He requires assistance with meals, household chores.  He manaages his  medications, dresses and does sponge bathing.    STATUS OF DISCHARGE OUTCOMES -     SIGNIFICANT BARRIERS -  chronic pain  Electronically signed by Hermelinda Medicus, RN at 08/12/2023  4:16 PM PDT

## (undated) NOTE — Telephone Encounter (Signed)
Formatting of this note might be different from the original.  Action Required: Review TE and place order or referral to complete request.  Reason for TE: Secondary Admit Notification  Next step:    Please contact patient to update of any changes to medications or appointments needed.  P: [phone]    Carlos Hancock was seen by [facility] RN on 07/03/2023.    Please review patients pain and insulin medications.     Patient requests that prescription for pain medication be modified to avoid missing doses and have pain out of control. Patient currently taking medications around the clock and reports acceptable pain being 7/10 and it may go to 9/10 when moving.    Patient inquires about need for short acting insulin, patient was receiving this type of medication at the hospital and states BG was better controlled, patient currently on Jardiance, Metformin and Semglee.    Please provide next steps for patients current left lower extremity condition. Patient c/o difficulty standing or walking d/t shooting pain, edema, and warmth, redness and greater edema on left knee. Pt elevates leg, but has not improved. Signs and symptoms of DVT/PE discussed with patient.    Please view summary in Home Health Notes. You can find the current summary in the encounter admit date 07/02/2023.    Open Chart Review, filter on Department specialty HOME HLTH/PALL/HSP.      Electronically signed by Shawna Clamp at 07/03/2023  4:05 PM PDT

## (undated) NOTE — Progress Notes (Signed)
 Formatting of this note might be different from the original.  HomeCare Document    Electronically signed by Shaune Leeks, MD at 08/17/2023  9:21 PM PDT

## (undated) NOTE — Progress Notes (Signed)
Formatting of this note is different from the original.  Images from the original note were not included.  RN FOLLOW UP  -  CHRONIC DISEASE MANAGEMENT    [x]  CDM Rn confirmed patient full name and date of birth.     HISTORY  Daris is a 53 year old male who has type 2 diabetes.    Goals and targets documented in RN assessment.    Summary of Plan from last contact:   We are increasing your morning dose of Semglee from 30 units to 35 units.  This is a 18% increase and is being done to target daily hyperglycemia.       Lab Results   Component Value Date    A1C 8.1 (H) 02/23/2023    A1C 8.2 (H) 12/11/2022    A1C 9.0 (H) 08/22/2022     Lab Results   Component Value Date    GFR 100 10/20/2022     Nursing Assessment:  Any items in red indicated areas of concern and education was provided unless otherwise noted in the plan.     Prescribed DM Orals:   metFORMIN (GLUCOPHAGE XR) 500 mg extended release (24 hr) tablet - Take 4 tablets (2,000 mg) by mouth daily  empagliflozin (JARDIANCE) 25 mg tablet - Take 0.5 tablets (12.5 mg) by mouth every morning     CDM Rn provided the following education on Metformin:    Metformin lowers blood sugar by:  Decreasing the amount of sugar produced by the liver.   Increasing the amount of sugar absorbed by muscle cells.  Decreasing the body's need for insulin.    Prescribed DM Non-insulin Injectables: N/A    Prescribed Insulin:       Insulin Type Basal Dose  Morning Basal Dose  Evening/HS Estimated Total Daily Dose  (All basal & all bolus doses)   Prescribed Doses Semglee  Vial 35 units  - 35 units    Taking (if different)  39 units   39 units        Mealtime (bolus) insulin:  Base dose or Carb ratio (ICR): N/A   Sliding Correction Scale (ISF): N/A     Titration parameters include: RN confirms correct titration parameters/medications are ordered in CDM Referral including Nursing Diabetes Medication Management of Type 2 Standing Order     Concerns about medication or insulin adherence:  No    Monitoring:   Patient is synced with One [employer] and patients blood glucose data is pasted below     Glucose concerns:   Yes; hyperglycemia    Patient is above glucose goal(s) fasting, pre-dinner and before bed.     Glucose Goals:  Fasting: 80-130 mg/dL  Pre-Meals: 54-098 mg/dL  Before Bed: <119 mg/dL   2 Hours After Eating: <180 mg/dL   Overall Glucose Average Goal: 80-150 mg/dL     CDM Rn to increase morning dose of Semglee to target hyperglycemia.    Patient states they agree to contact CDM Rn prior to next telephone appointment with two or more glucose levels below 70 mg/dL.     Blood pressure:   Current Medications:   atorvastatin (LIPITOR) 20 mg tablet - Take 1 tablet (20 mg) by mouth daily   Blood Pressure Clinic   02/11/2023 152/99    149/99   12/11/2022 152/82   09/10/2022 142/80    150/84   07/10/2022 140/90   03/24/2022 146/90       Patient in target:  no - above goal  Is RN managing HTN: RN will not manage patients BP, it will continue to be managed by PCP    Other areas addressed today:  Nutrition/Lifestyle:   CDM Rn provided education on MyPlate Method:  CDM Rn explained that there is a pattern of prandial hyperglycemia after meals and that this is most often related to nutritional intake.  CDM Rn explained that we want about 3/4 of plate to be a combination of protein and vegetable and about 1/4 of the plate to be carbohydrate.  CDM Rn explained that nutritional changes are one of the quickest, most effective way in reducing glucose levels.   CDM Rn explained that we do not currently endorse the ketogenic diet and that carbohydrates are still an important macronutrient but that patient should be focusing on carbohydrate moderation.  CDM Rn explained that even eating carbohydrates toward the end of the meal can help reduce glucose spike.     Reducing Risks:   Health Maintenance Due   Topic Date Due    Vaccine: COVID-19 (1) Never done    Vaccine: Shingles (1 of 2) Never done    Blood Pressure  Check  02/12/2023    CRCS: FIT (Home stool test)  04/04/2023    Lab Test: A1C (Diabetes)  05/26/2023    COT CARE PLAN UPDATE  06/30/2023    Depression F/U: [facility] Monitoring Tool  07/24/2023         03/26/2021     4:00 PM 02/07/2022     1:00 PM 03/25/2022     1:00 PM 05/26/2022     5:29 PM 09/10/2022    10:12 AM 12/11/2022     2:00 PM 03/24/2023     9:00 AM   BHI Summary   PHQ-9 Score 15 11 12 10    10 5 9 7    PHQ-9 Score (MyGH)    10 5     PHQ-2 Score 4 3 3 2    2 2 2 2    GAD-2 Score 4 2  2    2 2 2 2    Audit-C Score (Monitoring Tool) 0 0  0    0 0 0 0   Audit-C Score (BHI Screen)   0       Audit-C Score (MyGH)    0 0     Marijuana Use (Monitoring Tool) 0 0  0 0 0 0   Marijuana Use (BHI Screen)   0       Marijuana Use [facility])    Never Never     Drug Use (Monitoring Tool) 0 0  0 0 0 0   Drug Use (BHI Screen)   0       Drug Use [facility])    Never Never     Access to guns? (Monitoring Tool) 1 1  1 1 1 1    Access to guns? [facility]    Yes Yes         Social History     Tobacco Use    Smoking status: Never    Smokeless tobacco: Never   Substance Use Topics    Alcohol use: No    Drug use: No       PLAN:  Updated insulin prescriptions today:  yes      CDM Rn Actions:  Update CDM Problem List  Update patient prescription(s) in Epic    Patient Actions:  We are increasing your morning dose of Semglee from 39 units to 45 units.  This  is a 15% increase and is being done to target daily hyperglycemia.         Insulin Type Basal Dose  Morning Basal Dose  Evening/HS Estimated Total Daily Dose  (All basal & all bolus doses)   Prescribed Doses Semglee  Vial 45 units  - 45 units        Our next phone visit is scheduled for: 07/07/23 at 12:40pm    Amy Hanson-Murillo  Diabetes Nurse  Population Nursing Management    Electronically signed by Salena Saner, RN at 06/10/2023  1:04 PM PDT

## (undated) NOTE — Assessment & Plan Note (Signed)
Associated Problem(s): Paronychia of great toe  Formatting of this note might be different from the original.  Recurrent infection with discoloration and mild redness at the cuticle.   No oozing or pain. History of severe infection requiring ER visit in the past  Cannot assess severity correctly over the phone.   -Refer to podiatry for further evaluation and management as requested by patient.   -Advise patient to monitor for signs of worsening infection (increased redness, swelling, oozing, pain) and to contact the office if these occur.  Electronically signed by Shaune Leeks, MD at 10/08/2023 12:51 PM PDT

## (undated) NOTE — Progress Notes (Signed)
Formatting of this note is different from the original.  Diagnosis and Treatment Plan     (F33.41) Major depressive disorder, recurrent episode, in partial remission with anxious distress  (primary encounter diagnosis)  Comment: stable overall, as evidenced by adequate psychosocial functioning despite ongoing life stressors; patient reports benefits of psychotropic treatment outweigh risks and will complete echocardiogram   Plan: lamoTRIgine (LAMICTAL) 200 mg tablet, METHYLPHENIDATE 20 MG TABLET    (Z59.87) Inadequate material resources  Comment: lack of resources interferes with medical care  Plan: [employer] ENROLLMENT    Patient has been informed of the following care gaps: Yes    - A flu shot to protect against the flu virus.    - A follow up test for high blood pressure.        Interval History   Chief complaint: Depression    Session attended by: Patient     The patient reports his condition is stable overall since previous exam  He has succeeded in his efforts towards fully tapering his evening dose of methylphenidate for clinical depression to 10mg  only  He denies side effects of his treatment plan   He continues to await a verdict regarding the outcome of his child custody trial  He reports benefits of psychotropic regimen outweigh risks -- history of tachycardia and elevated blood pressures have improved overtime  Patient was scheduled for an echocardiogram today as recommended by previous cardiology e-consult   Patient did not have means of transportation due to financial strain and had to cancel the appointment     Current Prescriptions:  OXYCODONE 15 MG TABLET, Take 1 tablet (15 mg) by mouth every 4 hours as needed for pain (to last 28 days)  OXYCODONE ER 10 MG TABLET,CRUSH RESISTANT,EXTENDED RELEASE 12 HR, Take 1 tablet (10 mg) by mouth 3 times daily To last 28 days  METHYLPHENIDATE 20 MG TABLET, Take 0.5-1 tablets (10-20 mg) by mouth 2 to 3 times daily FOR DEPRESSION (LIMIT USE AS  TOLERATED), TO LAST 28 days  CHLORHEXIDINE GLUCONATE 0.12 % MOUTHWASH, Swish gentlty with 15 ml for 30 seconds and expectorate. Use twice daily for 10 days  AMOXICILLIN 500 MG CAPSULE, Take 1 capsule (500 mg) by mouth every 8 hours  INSULIN GLARGINE (U-100) 100 UNIT/ML SUBCUTANEOUS SOLUTION, Inject 50 units under the skin every morning AND 28 units every evening.  BAQSIMI 3 MG/ACTUATION NASAL SPRAY, Use one dose in nostril as needed for low blood sugar and call 911; if no response, may repeat in 15 minutes as needed using a new device.  ATORVASTATIN 20 MG TABLET, Take 1 tablet (20 mg) by mouth daily  TERBINAFINE HCL 250 MG TABLET, Take 1 tablet (250 mg) by mouth daily  LAMOTRIGINE 200 MG TABLET, Take 2 tablets (400 mg) by mouth daily at bedtime -- he takes this with lunch  ONETOUCH VERIO TEST STRIPS, Use as directed to test blood sugars 3 to 4 times daily  NALOXONE 4 MG/ACTUATION NASAL SPRAY, Call 911. Administer a single spray of naloxone (NARCAN) in one nostril. Repeat after 2-3 minutes as needed if no or minimal response.  Use new device for each spray.  METFORMIN ER 500 MG TABLET,EXTENDED RELEASE 24 HR, Take 4 tablets (2,000 mg) by mouth daily  INSULIN SYRINGE U-100 WITH NEEDLE 1 ML 31 GAUGE X 5/16", Use to inject insulin twice evening.  ONETOUCH DELICA LANCETS 33 GAUGE, Use as directed for testing blood sugar two times a day as needed    BHI Monitor  Detail 09/10/2022 05/26/2022 05/26/2022 03/25/2022 02/07/2022 03/26/2021 03/18/2021   Total Score (MyGH) 5 10 - - - - 15   Total Score 5 10 10 12 11 15 15    Anhedonia 1 1 1 1 1 2 2    Sadness 1 1 1 2 2 2 2    Sleep 2 2 2 3 3 3 3    Energy 1 3 3 3 1 3 3    Appetite 0 1 1 1 1 2 2    Failure 0 1 1 1 2 2 1    Concentration 0 1 1 1  0 1 2   Slowed or Restless 0 0 0 0 0 0 0   SUICIDE 0 0 0 0 1 0 0   Anhedonia (MyGH) Several days Several days - - - - More than half the days   Sadness [facility]) Several days Several days - - - - More than half the days   Sleep [facility]) More than half the days More  than half the days - - - - Nearly every day   Energy [facility]) Several days Nearly every day - - - - Nearly every day   Appetite [facility]) Not at all Several days - - - - More than half the days   Failure [facility]) Not at all Several days - - - - Several days   Concentration [facility]) Not at all Several days - - - - More than half the days   Slowed or Restless [facility]) Not at all Not at all - - - - Not at all   SUICIDE [facility]) Not at all Not at all - - - - Not at all   GAD-2 Feeling Nervous 1 1 1  - 1 2 2    GAD-2 Unable to stop worrying 1 1 1  - 1 2 3    GAD-2 Total 2 2 2  - 2 4 5    Functioning 1 2 2  - 3 3 3    Frequency of drinking 0 0 0 - 0 0 0   # of drinks per day 0 0 0 - 0 0 0   6 or more drinks on one occasion 0 0 0 - 0 0 0   Audit-C Total 0 0 0 - 0 0 0   Marijuana use 0 0 0 - 0 0 0   Illegal drug use 0 0 0 - 0 0 0   Access to guns? 1 1 1  - 1 1 1    Some recent data might be hidden     PHQ9 Entry Field    Mental Status Exam   Appearance: Well groomed  Behavior: No abnormal concerns noted  Mood/Affect: mostly neutral/restricted   Verbal presentation: No abnormal concerns noted  Orientation:  oriented to time, place, and person  Cognition: No abnormal concerns noted  Thought processes: No abnormal concerns noted  Homicidal ideation: No  Suicidal ideation: Denies suicidal ideation.    Visit Type    This Time Based Video visit consisted of 30 minutes of total time.Time may include direct patient care, reviewing pertinent records & follow-up activity. Location of Patient: Home in [place] Location of Provider: Home office      Labs and Vitals   {Last 1 Encounter Readings     Date: 09/10/2022  BP 142/80  Pulse 98  Temp 98.4 F (36.9 C) (Temporal)  Wt 185 lb 3.2 oz (84 kg)  SpO2 100%  BMI 25.81 kg/m2  ]  Lab Results   Component Value Date    SOD 141 10/20/2022  POT 4.3 10/20/2022    CHLOR 101 10/20/2022    CO2 31 10/20/2022    BUN 15 10/20/2022    CRE 0.90 10/20/2022    GLU 124 10/20/2022    GLUFAST 134 (H) 10/28/2012    A1C  9.0 (H) 08/22/2022     Lab Results   Component Value Date    WBC 8.1 02/07/2022    HGB 14.8 02/07/2022    HCT 41 02/07/2022    PLT 297 02/07/2022     Lab Results   Component Value Date    BILITOT 0.7 10/20/2022    PROT 7.2 10/20/2022    ALBU 4.6 10/20/2022    AST 12 10/20/2022    ALT 29 10/20/2022    ALKPHOS 108 10/20/2022     Lab Results   Component Value Date    CHOL 142 03/24/2022    HDL 47 03/24/2022    TRIG 161 03/24/2022    LDL 52 03/24/2022     Lab Results   Component Value Date    TSH 2.53 02/07/2022    VITB12 219 02/07/2022     Lab Results   Component Value Date    TEG 8.2 10/18/2010     Lab Results   Component Value Date    METHAMPHET NONE DETECTED 10/18/2010    AMPH NONE DETECTED 08/22/2022    COCA NONE DETECTED 08/22/2022    PCP NONE DETECTED 08/22/2022    THC NONE DETECTED 08/22/2022    METHADONEU NONE DETECTED 08/22/2022    OPI NONE DETECTED 08/22/2022    BENZO NONE DETECTED 08/22/2022    OXYCO DETECTED (A) 08/22/2022    BARB NONE DETECTED 08/22/2022     Past History     Other:   He has been received SSDI benefits for clinical depression since 1997    Past psychotropic medications include--  sertraline, lorazepam, Abilify, Ritalin, lamotrigine, Ambien, quetiapine, olanzapine, risperidone; complete record medication record requested from previous longstanding outside psychiatrist of record Dr. Azucena Fallen [phone] was never received)    Additional past medical history --   05/29/20 11:10 PM  [facility]  Cardiology agrees that the increased heart rate is likely related to your medications with the stimulants for ADD being the most likely culprit.  They do not feel there is a strong need to do an echocardiogram so I will not order one at this time  Dallas Breeding, MD      ORDERING:  Dr. Suzzanne Cloud.    The patient was monitored for 48 hours. Monitor was placed on March 27, 2020, and removed on March 29, 2020. Rhythm is sinus throughout with a   maximum heart rate of 159 beats per minute, a minimum heart rate of 81    beats per minute and average of 106 beats per minute. There were no   premature ventricular contractions. There were no premature atrial   contractions. There were no episodes of supraventricular tachycardia.   There were no episodes of ventricular tachycardia. There were no pauses   seen. The patient spent 71% of his time in sinus tachycardia.    IMPRESSION:  Sinus tachycardia.    WJS:jc  [id number]  Dictated:   04/03/2020 12:56:28 PM Daleen Bo, MD  Transcribed:04/03/2020 12:58:51 PM Cardiology          Electronically signed by Stevphen Rochester, MD at 11/10/2022 11:30 AM PST

## (undated) NOTE — Progress Notes (Signed)
 Formatting of this note might be different from the original.  HomeCare Document    Electronically signed by Shaune Leeks, MD at 07/10/2023 12:56 PM PDT

## (undated) NOTE — Progress Notes (Signed)
Formatting of this note is different from the original.  Subjective:  Patient ID: Carlos Hancock  is a 61 year old  male  here for:    Appointment Notes       Having more problems with my hands, my right I can?t close it to a fist and having similar problems with digits on my left          L middle finger will catch on the PIP joint, worse in the am catching for the last month.  Now it is actually getting stuck.    L 4th PIP is also popping now.    He is having troubles making a fist with the R hand particularly in the am.  Present for several months but worsening the last month.  No numbness or tingling ot the fingers except sometimes will right upper extremity to the 4th/5th fingers.  Right handed.  Sometimes seem swollen.  Stiffness improves after a few hours.  No other joint issues (not knees, etc).    Patient Active Problem List   Diagnosis    Right shoulder pain    Cervical vertebral fusion    Personal history of TIA (transient ischemic attack)    Sleep disorder    Ocular migraine    CHRONIC OPIOID THERAPY CARE PLAN    Deviated nasal septum    Type 2 diabetes mellitus with peripheral neuropathy    Encounter for long-term (current) use of insulin    Recurrent major depression resistant to treatment    Hypogonadotropic hypogonadism    Personal history of COVID-19    Long COVID    Type 2 diabetes mellitus with hyperglycemia, with long-term current use of insulin    Chronic pain     Medications:  Outpatient Medications Marked as Taking for the 12/11/22 encounter (Office Visit) with Lyda Jester, PA-C   Medication Sig Dispense Refill    ibuprofen (MOTRIN) 600 mg tablet Take 600 mg by mouth every 6 hours as needed      acetaminophen (TYLENOL) 500 mg tablet Take 500-1,000 mg by mouth every 6 hours as needed      methylphenidate HCl (RITALIN) 20 mg tablet Take 0.5-1 tablets (10-20 mg) by mouth 2 to 3 times daily FOR DEPRESSION (LIMIT USE AS TOLERATED), TO LAST 28 days 84 tablet 0    oxyCODONE (OXYCONTIN) 10 mg  extended release (12 hr) tablet Take 1 tablet (10 mg) by mouth 2 times daily To last 28 days 56 tablet 0    oxyCODONE (ROXICODONE) 15 mg tablet Take 1 tablet (15 mg) by mouth every 4 hours as needed for pain (to last 28 days) 168 tablet 0    lamoTRIgine (LAMICTAL) 200 mg tablet Take 2 tablets (400 mg) by mouth daily with lunch 180 tablet 1    insulin glargine (LANTUS U-100 INSULIN) 100 unit/mL sub-q vial Inject 50 units under the skin every morning AND 28 units every evening. 90 mL 3    glucagon (BAQSIMI) 3 mg/actuation nasal spray Use one dose in nostril as needed for low blood sugar and call 911; if no response, may repeat in 15 minutes as needed using a new device. 2 each 1    atorvastatin (LIPITOR) 20 mg tablet Take 1 tablet (20 mg) by mouth daily 90 tablet 3    terbinafine HCL (LAMISIL) 250 mg tablet Take 1 tablet (250 mg) by mouth daily 90 tablet 0    blood glucose test strips (ONETOUCH VERIO) Use as directed to test  blood sugars 3 to 4 times daily 400 each 3    naloxone (NARCAN) 4 mg/actuation nasal spray Call 911. Administer a single spray of naloxone (NARCAN) in one nostril. Repeat after 2-3 minutes as needed if no or minimal response.  Use new device for each spray. 2 each 99    metFORMIN (GLUCOPHAGE XR) 500 mg extended release (24 hr) tablet Take 4 tablets (2,000 mg) by mouth daily 360 tablet 2    insulin syringe-needle U-100 1 mL 31 gauge x 5/16 Use to inject insulin twice evening. 200 each 5    lancets 33 gauge [facility] DELICA) Use as directed for testing blood sugar two times a day as needed 100 each 11     Review of Systems:   See HPI.    Objective:  BP 152/82 (BP Location: Left arm, Patient Position: Sitting)   Pulse 106   Temp 98.1 F (36.7 C) (Temporal)   Ht 5\' 11"  (1.803 m)   Wt 190 lb (86.2 kg)   SpO2 95%   BMI 26.50 kg/m   Gen: alert, WNWD male in NAD, non-toxic  [place]  Intrinsic strength bilateral hands intact  Neg Phalens and Tinels bilaterally.   Sensation intact to light  touch.  He does have triggering noted on L middle PIP  He is not able to fully make fist with right hand but almost  No unusual swelling of joints.  Skin: great toenail onychomycosis is improving, healthy nail proximal third.    Assessment and Plan:  Relevant encounter diagnosis codes:   (M79.641,  M79.642) Pain in both hands  (primary encounter diagnosis)  (Z11.4) Encounter for screening for HIV  (Z11.59) Need for hepatitis C screening test  (B35.1) Onychomycosis  (E11.42) Type 2 diabetes mellitus with peripheral neuropathy  (M65.332) Trigger middle finger of left hand  (M25.60) Stiffness in joint  (Z23) Need for vaccination     Taken from patient instructions, with provider comments as appropriate:   1. Encounter for screening for HIV  Reviewed recommendations and agreed to proceed.  - HIV SCREENING TEST W/REFLEX    2. Need for hepatitis C screening test  Reviewed recommendations and agreed to proceed.  - HEPATITIS C SCREENING (REFLEX) [facility])    3. Onychomycosis  You completed 3 months of the anitfungal, I would monitor your nails for the next 3 months as they grow out  You can schedule an appointment for footcare to assist with trimming of the nails, referral was already placed.    4. Type 2 diabetes mellitus with peripheral neuropathy  You can obtain the a1c today.  - HEMOGLOBIN A1C    5. Pain in both hands  You are having pain and stiffness that is lasting for hours  This could just be related to wear and tear (osteoarthritis) but also could be inflammatory arthritis  I would like you to complete xrays and additional labs looking for inflammatory arthritis. If there are signs of inflammatory arthritis, I will have you see a specialist (rheumatologist)  If arthritis (wear and tear) we can refer you to occupational therapy to help with different splints that can be helpful  Continue the topical voltaren gel.  You can take ibuprofen periodically. If you are taking ibuprofen more regularly, we could switch you to a  different medication instead called meloxicam.  You can also try compression gloves-that can make the hands feel better.    - X-RAY HAND 3V (STD) PA+OBLQ+LAT  - REF ORTHO INTERNAL    6. Trigger  middle finger of left hand  Steroid injection can often help this. I have referred you to ortho for this.  - REF ORTHO INTERNAL    7. Stiffness in joint  - CRP (C-REACTIVE PROTEIN FOR INFLAMMATION) (G  - CYCLIC CITRULLINATED PEPTIDE AB  - RHEUMATOID FACTOR, QUANT  - ANA SCREEN (WITH TITER IF INDICATED) ([facility])  - REF ORTHO INTERNAL    No follow-ups on file.          Electronically signed by Lyda Jester, PA-C at 12/11/2022  3:26 PM PST

## (undated) NOTE — Progress Notes (Signed)
 Formatting of this note might be different from the original.  HomeCare Document    Electronically signed by Shaune Leeks, MD at 08/19/2023  1:26 PM PDT

## (undated) NOTE — Assessment & Plan Note (Signed)
Associated Problem(s): Hypogonadotropic hypogonadism  Formatting of this note might be different from the original.  Patient on exogenous testosterone with recent lab results showing low-normal testosterone levels.  -Continue current regimen.  -Order testosterone levels in three months  Electronically signed by Shaune Leeks, MD at 08/30/2023  4:33 PM PDT
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
## (undated) NOTE — Assessment & Plan Note (Signed)
Associated Problem(s): Spondylosis of cervical spine with radiculopathy  Formatting of this note might be different from the original.  Chronic condition with recent exacerbation of symptoms including radicular symptoms in the upper arms. MRI shows foraminal stenosis, more severe on the left side, and disc bulge at C3 and C4.  -Refer to spine specialist  Electronically signed by Shaune Leeks, MD at 08/30/2023  4:29 PM PDT

## (undated) NOTE — Progress Notes (Signed)
Formatting of this note might be different from the original.  BRIEF PAIN PHARMD NOTE    Discussed the patient's post-operative management with Dr. Michaell Cowing with the Acute Pain Service (APS) on 7/5. His acute pain is currently managed with Oxycontin 30 mg BID and oxycodone 15 mg Q3H (8 tabs/day) for a total MED of 270 mg (pre-injury MED: 154 mg). The APS plan to develop an opioid taper (to be discussed with the patient) to get the patient back to his baseline regimen over 1 month. Upon discharge, he will go to a SNF where the SNF will continue to taper the patient based on the developed plan.   Upon discharge from the SNF, the [facility] continue to manage his opioids and continue to taper him back to his baseline regimen if he is discharged from the SNF prior to 1 month.     Izora Gala, PharmD, BCPS  Clinical Pharmacy Specialist - Pain Management      Electronically signed by Jonny Ruiz, PharmD at 06/29/2023  8:11 AM PDT
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
## (undated) NOTE — Progress Notes (Signed)
 Formatting of this note might be different from the original.  HomeCare Document    Electronically signed by Shaune Leeks, MD at 08/07/2023  8:10 AM PDT
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
## (undated) NOTE — Telephone Encounter (Signed)
Signed Prescriptions:                        Disp   Refills  syringe with needle (BD LUER-LOK) 3 mL 23 *8 each 4      Sig: Use to inject testosterone every 2 weeksAuthorizing Provider: Shaune Leeks-------------------------------------------------------------------  Electronically signed by Shaune Leeks, MD at 05/27/2023 10:28 AM PDT
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
## (undated) NOTE — Telephone Encounter (Signed)
Formatting of this note might be different from the original.  Controlled type 2 diabetes mellitus with diabetic neuropathy, with long-term current use of insulin  Called attempt not able to leave voice mail.    Urgent referral use sda any od any location.     Electronically signed by Etta Quill, OphAsst at 05/02/2020  4:26 PM PDT

## (undated) NOTE — Progress Notes (Signed)
Formatting of this note might be different from the original.    CMR attempt x 1 then pt was admitted for prolonged hospital stay. Will close encounter.    Eloy End, PharmD  Clinical Pharmacist          Electronically signed by Miki Kins, PharmD at 06/26/2023  9:36 AM PDT
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
## (undated) NOTE — Progress Notes (Signed)
Formatting of this note might be different from the original.  Attempted to contact the patient via phone to complete pre-visit prep. SM sent to the pt to complete E checkin   Electronically signed by Rosilyn Mings, MA at 12/11/2022  3:26 PM PST

## (undated) NOTE — Assessment & Plan Note (Signed)
 Associated Problem(s): Trigger middle finger of left hand  Formatting of this note might be different from the original.  Severe morning stiffness, difficulty straightening finger. Pain down to the joint in the hand.  -Refer to family practice provider for trigger finger injection. Was previously referred to ortho but patient could not follow up  -Advise patient to obtain a finger splint from [facility] clinic  -If steroid injection is not effective, consider referral to orthopedics for possible trigger finger release surgery.  Electronically signed by Shaune Leeks, MD at 10/08/2023  1:06 PM PDT
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
## (undated) NOTE — Telephone Encounter (Signed)
Formatting of this note might be different from the original.  Refill(s) are unable to be approved by the Centralized Refill Management Program as the requested medication(s) requires provider review and authorization. Please review request and take appropriate action. If medication is denied or additional follow up is needed, please work with your care team to notify patient.    Lovell Sheehan, PhT  Centralized Refill Management  Electronically signed by Lovell Sheehan, PhT at 05/18/2023  5:59 AM PDT

## (undated) NOTE — Progress Notes (Signed)
 Formatting of this note might be different from the original.  HomeCare Document    Electronically signed by Shaune Leeks, MD at 07/06/2023  8:10 AM PDT

---

## 1990-12-22 DIAGNOSIS — T1490XA Injury, unspecified, initial encounter: Secondary | ICD-10-CM

## 1990-12-22 HISTORY — DX: Injury, unspecified, initial encounter: T14.90XA

## 1994-12-22 DIAGNOSIS — K769 Liver disease, unspecified: Secondary | ICD-10-CM

## 1994-12-22 HISTORY — DX: Liver disease, unspecified: K76.9

## 2002-06-08 ENCOUNTER — Ambulatory Visit (INDEPENDENT_AMBULATORY_CARE_PROVIDER_SITE_OTHER): Payer: No Typology Code available for payment source

## 2002-06-08 NOTE — Nursing Note (Signed)
>>   Carlos Hancock, Carlos Hancock                 06/08/2002 2:43 pm  LMP:  not applicable    Thrombocytopenia w/I last 6 weeks: No    Steroid usage (>2mg /kg/d or > 20mg /day) for 14 days (pts with asthma, allergies, Collagen  Vascular Disease):  No    Severe allergic reaction to prior M, M, R, MMR, gelatin, neomycin:  No    Immunosuppression/ immunodeficient:  cancer, therapy with alkylating agents, antimetabolites,  radiation, steroids (see above), generalized malignancies, congenital immunodeficiency, symptomatic HIV, leukemia, lymphoma:  No    Moderate or severe current febrile illness:  No   Recent blood products:  No    VIS read:  YES --     Currently pregnant or planning to become pregnant in next 1 month:  No

## 2002-07-22 ENCOUNTER — Encounter (INDEPENDENT_AMBULATORY_CARE_PROVIDER_SITE_OTHER): Payer: Self-pay | Admitting: General Practice

## 2002-07-22 ENCOUNTER — Ambulatory Visit (INDEPENDENT_AMBULATORY_CARE_PROVIDER_SITE_OTHER): Payer: No Typology Code available for payment source | Admitting: General Practice

## 2002-07-22 VITALS — BP 124/84 | HR 80 | Resp 16

## 2002-07-22 NOTE — Progress Notes (Signed)
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 He has problems with depression. He was on Effexor. This was not working to well therefore his psychiatrist has started him on Lamictal. He still is taking Effexor as well while slowly increasing the dose of Lamictal.He has taken Lamictal for two weeks now and just increased the dose. Last week he developed a rash in the right groin. Yesterday a rash started over the anterior chest area. He was instructed to come in for evaluation.  At no time did he have other symptoms such as congestion, wheezing, breathing problems, nausea.  Health Hx was taken.  Px: Alert young man in no obvious distress. He is half [place].  Eyes: Conjuctiva are white  Skin: There is a localized red area just above the right groin.  Over the lower part of the sternum there is a small area of skin irritation with slight, hardly noticeable redness with faint papules.   There are no widespread papules, macules, wheels or urticaria.  Impression: Skin rash - fairly localized.   Cause: Heat rash vs drug reaction.   It has been very hot over the past few days and the patient as well as myself believe that the present problem is related to the heat.  He will take the next pill. He knows there is a small chance that this may be pill related. If the rash spreads and gets worse he will stop the medication and talk to his psychiatrist.

## 2002-07-22 NOTE — Nursing Note (Signed)
>>   Carlos Hancock              07/22/2002 3:50 pm  36 y/o male in for poss. reaction to new med x2d.

## 2002-07-26 ENCOUNTER — Encounter (INDEPENDENT_AMBULATORY_CARE_PROVIDER_SITE_OTHER): Payer: Self-pay | Admitting: General Practice

## 2016-03-25 ENCOUNTER — Ambulatory Visit

## 2016-03-25 ENCOUNTER — Ambulatory Visit (INDEPENDENT_AMBULATORY_CARE_PROVIDER_SITE_OTHER): Admitting: Physician Assistant

## 2016-03-25 VITALS — BP 151/101 | HR 109 | Temp 97.9°F | Resp 18 | Ht 70.0 in | Wt 232.0 lb

## 2016-03-25 DIAGNOSIS — S56912A Strain of unspecified muscles, fascia and tendons at forearm level, left arm, initial encounter: Secondary | ICD-10-CM

## 2016-03-25 DIAGNOSIS — M25522 Pain in left elbow: Secondary | ICD-10-CM

## 2016-03-25 DIAGNOSIS — S46912A Strain of unspecified muscle, fascia and tendon at shoulder and upper arm level, left arm, initial encounter: Secondary | ICD-10-CM | POA: Diagnosis not present

## 2016-03-25 NOTE — Patient Instructions (Signed)
     IF you received an x-ray today, you will receive an invoice from [facility]. Please contact [facility] at [phone] with questions or concerns regarding your invoice.   IF you received labwork today, you will receive an invoice from [facility]. Please contact [facility] at [phone] with questions or concerns regarding your invoice.   Our billing staff will not be able to assist you with questions regarding bills from these companies.  You will be contacted with the lab results as soon as they are available. The fastest way to get your results is to activate your My Chart account. Instructions are located on the last page of this paperwork. If you have not heard from us regarding the results in 2 weeks, please contact this office.      

## 2016-03-25 NOTE — Progress Notes (Signed)
[facility] [address] [place] [address] [phone]  Date:  03/25/2016   Name:  Steven Cox   DOB:  Apr 11, 1966   MRN:  [id number]  PCP:  No PCP Per Patient    Chief Complaint: Arm Injury   History of Present Illness:  This is a 50 y.o. male who is presenting with left shoulder and left elbow pain after an injury at work yesterday, 03/24/16. Works as a Advertising account planner. States he reached behind him to grab a package and felt a pop in his shoulder and in his elbow. Shoulder feels a little sore. He is having most of his pain though just superior to elbow and radiates to mid forearm. States he feels a little weak in his hand and his fingers all feel numb. Has not tried anything for his pain. No shoulder or elbow injury before. He is right hand dominant.  Pt does not have a PCP currently.  Review of Systems:  Review of Systems See HPI  There are no active problems to display for this patient.  Home meds reviewed.  Allergies  Allergen Reactions  . Neosporin [Neomycin-Bacitracin Zn-Polymyx]    Medication list has been reviewed and updated.  Physical Examination:  Physical Exam  Constitutional: He is oriented to person, place, and time. He appears well-developed and well-nourished. No distress.  HENT:  Head: Normocephalic and atraumatic.  Right Ear: Hearing normal.  Left Ear: Hearing normal.  Nose: Nose normal.  Eyes: Conjunctivae and lids are normal. Right eye exhibits no discharge. Left eye exhibits no discharge. No scleral icterus.  Cardiovascular: Normal rate, regular rhythm and normal pulses.   Pulmonary/Chest: Effort normal. No respiratory distress.  Musculoskeletal:       Left shoulder: He exhibits tenderness (mild, lateral deltoid). He exhibits normal range of motion, no bony tenderness and no swelling.       Right elbow: Normal.      Left elbow: He exhibits decreased range of motion (mildly decreased extension d/t pain). He exhibits no swelling.  Tenderness found. Medial epicondyle and lateral epicondyle tenderness noted. No olecranon process tenderness noted.  Tender over proximal ventral and dorsal forearm, most tenderness located to lateral muscle group just distal to elbow. Full strength in shoulder and elbow. Full strength hand grasp and finger opposition. Sensation intact.  Neurological: He is alert and oriented to person, place, and time.  Skin: Skin is warm, dry and intact. No lesion and no rash noted.  Psychiatric: He has a normal mood and affect. His speech is normal and behavior is normal. Thought content normal.   BP 154/92 mmHg  Pulse 109  Temp(Src) 97.9 F (36.6 C)  Resp 18  Ht  (1.778 m)  Wt 232 lb (105.235 kg)  BMI 33.29 kg/m2  SpO2 98%  Dg Elbow Complete Left  03/25/2016  CLINICAL DATA:  Left shoulder and elbow injury at work yesterday. Felt a pop. EXAM: LEFT ELBOW - COMPLETE 3+ VIEW COMPARISON:  None. FINDINGS: There is no evidence of fracture, dislocation, or joint effusion. There is no evidence of arthropathy or other focal bone abnormality. Soft tissues are unremarkable. IMPRESSION: Negative. Electronically Signed   By: Charlett Nose M.D.   On: 03/25/2016 10:58    Assessment and Plan:  1. Forearm strain, left, initial encounter 2. Elbow pain, left 3. Shoulder strain, left, initial encounter Radiograph negative. Likely shoulder and forearm strain. Counseled on ibuprofen/tylenol, heat and rest. He will be on light duty for 1 week.  Return in 1 week for follow up. - DG Elbow Complete Left; Future   Roswell MinersNicole V. Dyke BrackettBush, PA-C, MHS [facility]  03/25/2016

## 2016-04-01 ENCOUNTER — Ambulatory Visit (INDEPENDENT_AMBULATORY_CARE_PROVIDER_SITE_OTHER): Admitting: Physician Assistant

## 2016-04-01 VITALS — BP 140/100 | HR 125 | Temp 98.4°F | Resp 18 | Ht 70.0 in | Wt 232.2 lb

## 2016-04-01 DIAGNOSIS — S56912D Strain of unspecified muscles, fascia and tendons at forearm level, left arm, subsequent encounter: Secondary | ICD-10-CM | POA: Diagnosis not present

## 2016-04-01 DIAGNOSIS — S46912D Strain of unspecified muscle, fascia and tendon at shoulder and upper arm level, left arm, subsequent encounter: Secondary | ICD-10-CM

## 2016-04-01 NOTE — Patient Instructions (Addendum)
Follow up with ortho for further evaluation All of your care will be in ortho's hands now  IF you received an x-ray today, you will receive an invoice from [facility]. Please contact [facility] at [phone] with questions or concerns regarding your invoice.   IF you received labwork today, you will receive an invoice from [facility]. Please contact [facility] at [phone] with questions or concerns regarding your invoice.   Our billing staff will not be able to assist you with questions regarding bills from these companies.  You will be contacted with the lab results as soon as they are available. The fastest way to get your results is to activate your My Chart account. Instructions are located on the last page of this paperwork. If you have not heard from us regarding the results in 2 weeks, please contact this office.

## 2016-04-01 NOTE — Progress Notes (Signed)
[facility] [address], [place] [address] [phone]  Date:  04/01/2016   Name:  Steven Cox   DOB:  12-21-1966   MRN:  [id number]  PCP:  No PCP Per Patient    Chief Complaint: Follow-up   History of Present Illness:  This is a 50 y.o. right hand dominant male who is presenting for follow up worker's comp injury on 03/24/16. 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 He reached behind his back to grab a package and felt a pop in his left shoulder and elbow. Most of pain was in elbow and forearm, shoulder was just a little sore. He had full strength and ROM of shoulder and elbow. Radiograph of elbow was negative. Diagnosed likely forearm and shoulder strain. Counseled on heat, rest, elevation, ibuprofen/tylenol. Placed on light duty at work.  Today he is reporting that pain is not much better. Not worse either. Having a lot of pain in lateral forearm. Difficulty fully extending elbow d/t pain. Having numbness in all fingers, esp digits 1-3. He has been on light duty at work. He is needing me to full out forms for his 2nd job at [employer]. He took ibuprofen a few times but upset his stomach. He has been applying heat.  Review of Systems:  Review of Systems See HPI  There are no active problems to display for this patient.   Prior to Admission medications   Medication Sig Start Date End Date Taking? Authorizing Provider  glucosamine-chondroitin 500-400 MG tablet Take 1 tablet by mouth 3 (three) times daily.   Yes Historical Provider, MD  loratadine (CLARITIN) 10 MG tablet Take 10 mg by mouth daily.   Yes Historical Provider, MD    Allergies  Allergen Reactions  . Neosporin [Neomycin-Bacitracin Zn-Polymyx]    Medication list has been reviewed and updated.  Physical Examination:  Physical Exam  Constitutional: He is oriented to person, place, and time. He appears well-developed and well-nourished. No distress.  HENT:  Head: Normocephalic and atraumatic.  Right Ear:  Hearing normal.  Left Ear: Hearing normal.  Nose: Nose normal.  Eyes: Conjunctivae and lids are normal. Right eye exhibits no discharge. Left eye exhibits no discharge. No scleral icterus.  Pulmonary/Chest: Effort normal. No respiratory distress.  Musculoskeletal:       Left shoulder: Normal. He exhibits normal range of motion and no tenderness.       Left elbow: He exhibits decreased range of motion (mildly decreased extension). He exhibits no swelling.       Left forearm: He exhibits tenderness (lateral forearm). He exhibits no bony tenderness.  Full elbow strength. Significant pain with resisted wrist extension  Neurological: He is alert and oriented to person, place, and time.  Skin: Skin is warm, dry and intact. No lesion and no rash noted.  Psychiatric: He has a normal mood and affect. His speech is normal and behavior is normal. Thought content normal.    BP 140/100 mmHg  Pulse 125  Temp(Src) 98.4 F (36.9 C) (Oral)  Resp 18  Ht 5\' 10"  (1.778 m)  Wt 232 lb 3.2 oz (105.325 kg)  BMI 33.32 kg/m2  SpO2 98%  Assessment and Plan:  1. Forearm strain, left, subsequent encounter 2. Shoulder strain, left, subsequent encounter Counseled on heat, tylenol. Counseled on ROM to avoid stiffness at elbow joint. Referred to ortho for further eval, they will likely set up for PT. I filled out multiple forms for both his jobs. - Ambulatory referral to Orthopedic  Surgery    [place] Miners. Dyke Brackett, MHS [facility]  04/01/2016

## 2016-04-10 ENCOUNTER — Telehealth: Payer: Self-pay

## 2016-04-10 NOTE — Telephone Encounter (Signed)
[facility] faxed in [employer] forms that they state needs to be updated and corrected. According to the original forms the patient is not wanting to be out of work for a continuous leave which is marked on question 10, but on the new form they just faxed it states he wants to be out of work from 03/24/16 to 05/13/16 I was not sure about this because of the way the paperwork was filled out before so I am placing the letter they sent plus a copy of the old forms in your box on 04/10/16 if you could let me know what needs to be done with this. Thank you!  If it is ok to do then please fill out and return to the FMLA/Disability box at the 102 checkout desk within 5-7 business days.

## 2016-04-14 NOTE — Telephone Encounter (Signed)
FYI Called pt to get more information. He states I don't need to worry about these FMLA papers anymore because he is going in tomorrow to [employer] to quit.

## 2016-04-16 ENCOUNTER — Telehealth: Payer: Self-pay | Admitting: Radiology

## 2016-04-16 NOTE — Telephone Encounter (Signed)
At the patient's request, I made a copy of his x-ray on CD.

## 2016-08-20 IMAGING — CR DG KNEE AP/LAT W/ SUNRISE*L*
5 series · 5 of 5 positions shown · non-contrast
Comparison: None.

CLINICAL DATA: Left knee twisting injury.  Initial encounter.

EXAM:
DG KNEE - 3 VIEWS

[AP]
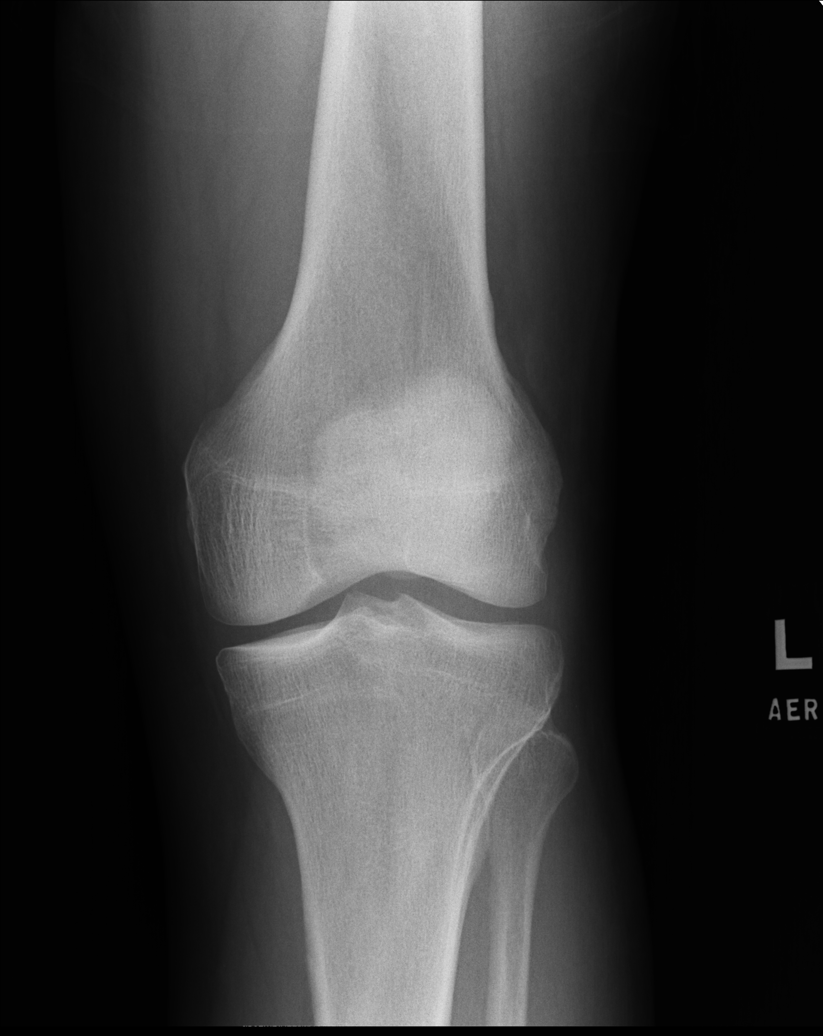

[lateral]
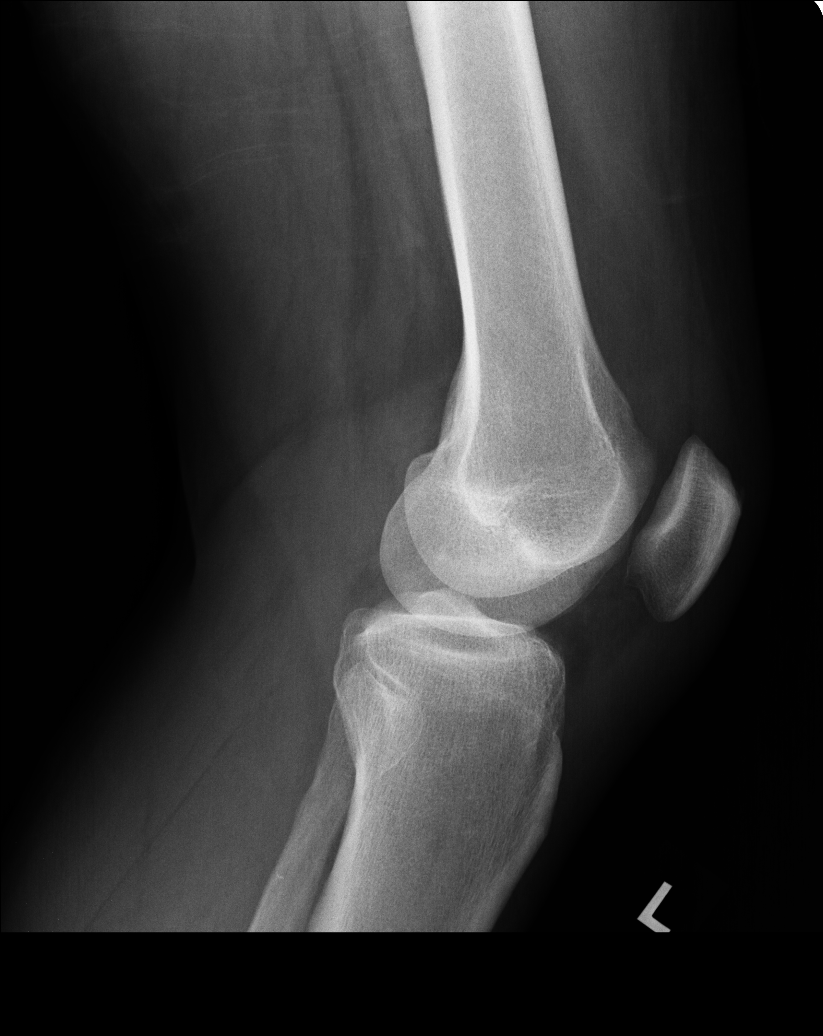

[ap ext rot]
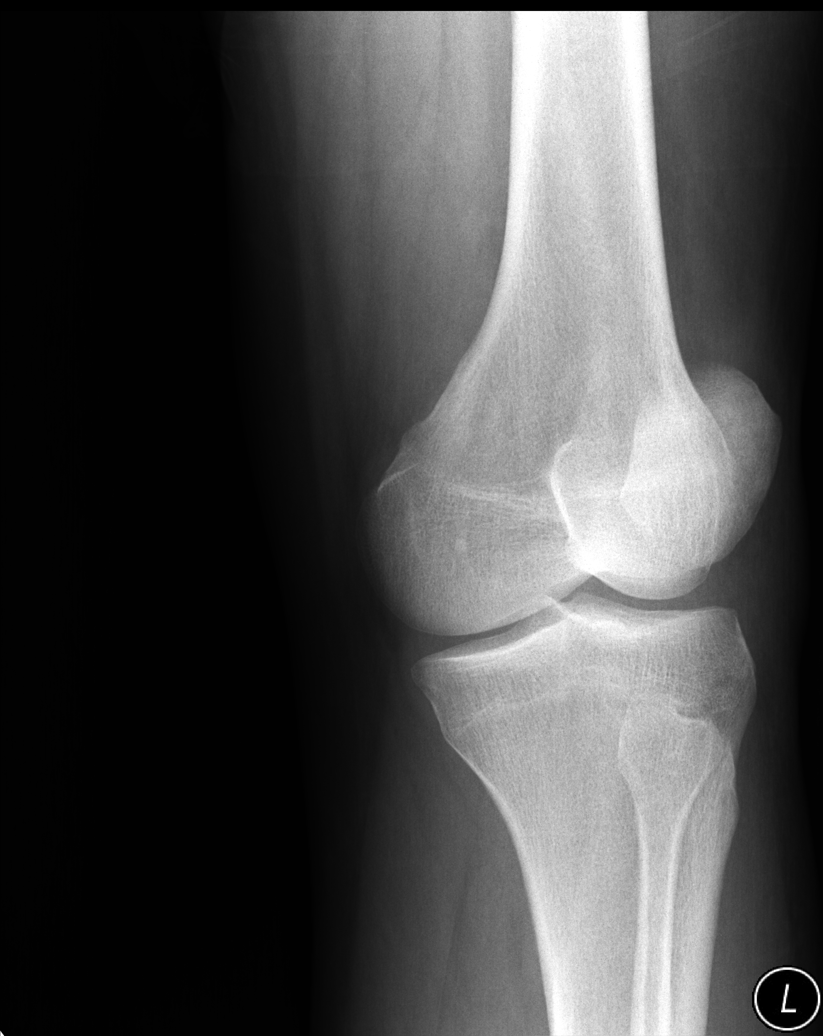

[ap int rot]
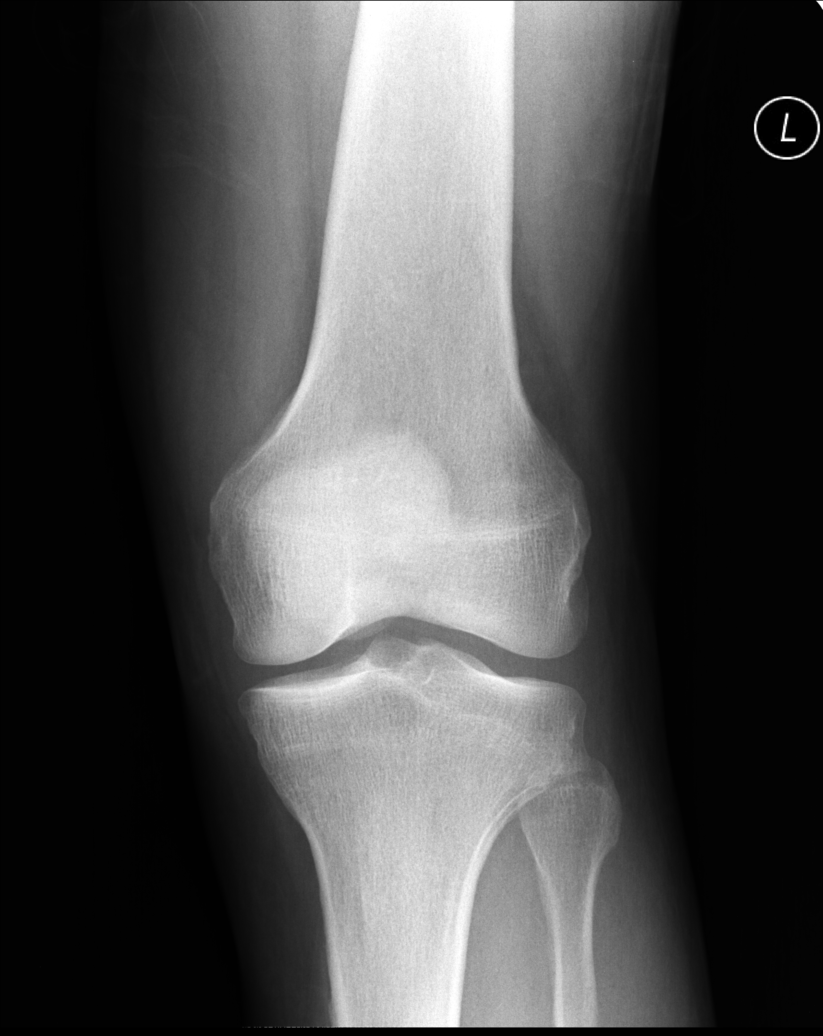

[sunrise]
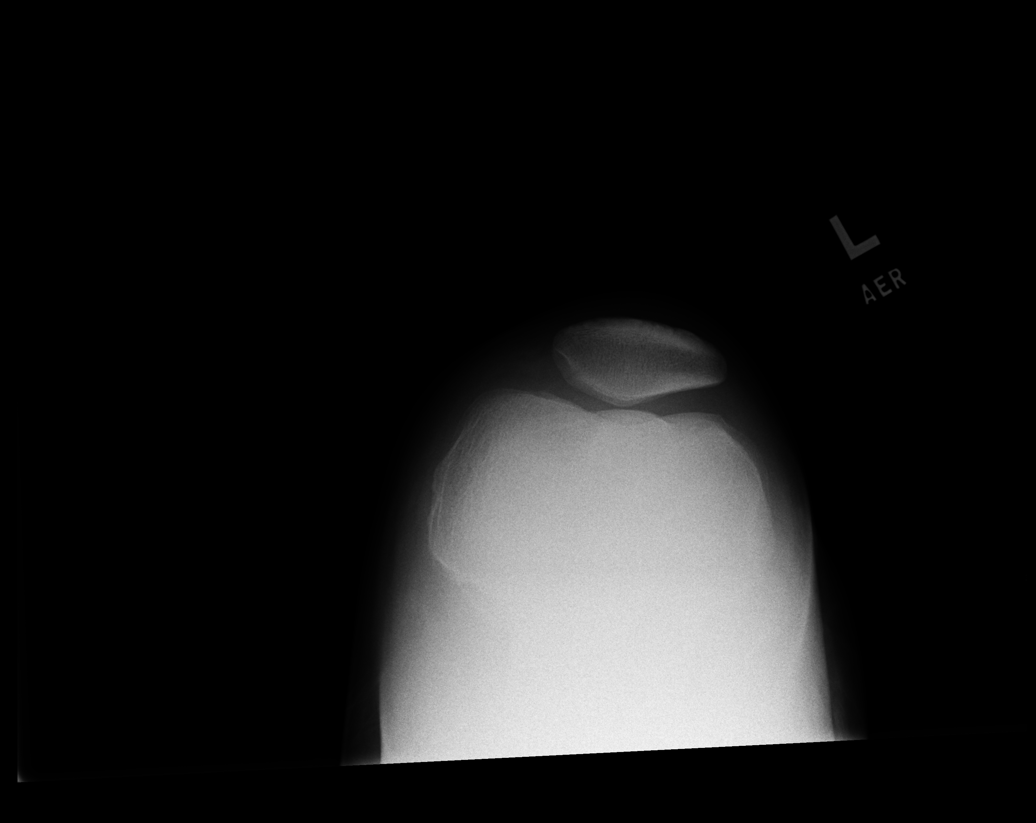

[5 of 5 positions shown; findings below may reference images not displayed]

FINDINGS: The mineralization and alignment are normal. There is no evidence of
acute fracture or dislocation. The joint spaces are maintained.
There is suprapatellar soft tissue fullness consistent with a joint
effusion. No focal soft tissue swelling identified.
IMPRESSION: No acute osseous findings. Small joint effusion, potentially a
manifestation of internal derangement.

## 2016-12-01 ENCOUNTER — Ambulatory Visit (INDEPENDENT_AMBULATORY_CARE_PROVIDER_SITE_OTHER): Admitting: Family Medicine

## 2016-12-01 VITALS — BP 136/88 | HR 107 | Temp 98.2°F | Resp 17 | Ht 69.5 in | Wt 231.0 lb

## 2016-12-01 DIAGNOSIS — S46911A Strain of unspecified muscle, fascia and tendon at shoulder and upper arm level, right arm, initial encounter: Secondary | ICD-10-CM

## 2016-12-01 DIAGNOSIS — M25511 Pain in right shoulder: Secondary | ICD-10-CM | POA: Diagnosis not present

## 2016-12-01 NOTE — Patient Instructions (Addendum)
   Continue shoulder sling, Robaxin if needed, or hydrocodone if needed for breakthrough pain. Be  careful combining these medications as both can cause sedation. I did refer you to orthopedics, but if you are running out of your medication prior to that visit, let me know and I temporarily refill them.  Return to the clinic or go to the nearest emergency room if any of your symptoms worsen or new symptoms occur.    IF you received an x-ray today, you will receive an invoice from [facility]. Please contact [facility] at [phone] with questions or concerns regarding your invoice.   IF you received labwork today, you will receive an invoice from [facility]. Please contact [facility] at [phone] with questions or concerns regarding your invoice.   Our billing staff will not be able to assist you with questions regarding bills from these companies.  You will be contacted with the lab results as soon as they are available. The fastest way to get your results is to activate your My Chart account. Instructions are located on the last page of this paperwork. If you have not heard from us regarding the results in 2 weeks, please contact this office.

## 2016-12-01 NOTE — Progress Notes (Signed)
Subjective:  By signing my name below, I, Steven Cox, attest that this documentation has been prepared under the direction and in the presence of Meredith StaggersJeffrey Kolby Myung, MD.  Electronically Signed: Andrew Auaven Cox, ED Scribe. 12/01/2016. 12:59 PM.   Patient ID: Steven Cox, male    DOB: July 12, 1966, 50 y.o.   MRN: [id number]  HPI   Chief Complaint  Patient presents with  . Shoulder Pain    right side    HPI Comments: Steven Cox is a 50 y.o. male who [facility] pain. [facility]. Diagnosed with right shoulder sprain and placed in a sling. Mechanism based on their notes was slipping falling down stairs and grabbing bannister causing injury.  Date of injury was 12/9. 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 He was treated with fentanylin the ED and prescribed hydrocodone and robaxin. Shoulder XR reviewed, no acute fx or dislocation. Pt has continued to have pain. He reports worsening pain with movement of right arm. He also notes grinding noise when moving shoulder.  He last took hydrocodone last night and robaxin this morning. He has also been using ice. He denies past shoulder injuries.   There are no active problems to display for this patient.  Past Medical History:  Diagnosis Date  . Allergy    No past surgical history on file. Allergies  Allergen Reactions  . Neosporin [Neomycin-Bacitracin Zn-Polymyx]    Prior to Admission medications   Medication Sig Start Date End Date Taking? Authorizing Provider  aspirin 81 MG chewable tablet Chew by mouth daily.   Yes Historical Provider, MD  atorvastatin (LIPITOR) 40 MG tablet Take 40 mg by mouth daily.   Yes Historical Provider, MD  HYDROcodone-acetaminophen (NORCO) 10-325 MG tablet Take 1 tablet by mouth every 6 (six) hours as needed.   Yes Historical Provider, MD  levothyroxine (SYNTHROID, LEVOTHROID) 50 MCG tablet Take 50 mcg  by mouth daily before breakfast.   Yes Historical Provider, MD  lisinopril (PRINIVIL,ZESTRIL) 10 MG tablet Take 10 mg by mouth daily.   Yes Historical Provider, MD  methocarbamol (ROBAXIN) 500 MG tablet Take 500 mg by mouth 4 (four) times daily.   Yes Historical Provider, MD  sitaGLIPtin-metformin (JANUMET) 50-1000 MG tablet Take 1 tablet by mouth 2 (two) times daily with a meal.   Yes Historical Provider, MD  glucosamine-chondroitin 500-400 MG tablet Take 1 tablet by mouth 3 (three) times daily.    Historical Provider, MD  loratadine (CLARITIN) 10 MG tablet Take 10 mg by mouth daily.    Historical Provider, MD   Social History   Social History  . Marital status: Married    Spouse name: N/A  . Number of children: N/A  . Years of education: N/A   Occupational History  . Not on file.   Social History Main Topics  . Smoking status: Former Games developermoker  . Smokeless tobacco: Not on file  . Alcohol use Not on file  . Drug use: Unknown  . Sexual activity: Not on file   Other Topics Concern  . Not on file   Social History Narrative  . No narrative on file   Review of Systems  Musculoskeletal: Positive for arthralgias and myalgias.  Neurological: Negative for weakness and numbness.       Objective:   Physical Exam  Constitutional: He is oriented to person, place, and time. He appears well-developed and well-nourished. No  distress.  HENT:  Head: Normocephalic and atraumatic.  Eyes: Conjunctivae and EOM are normal.  Neck: Neck supple.  Cardiovascular: Normal rate.   Pulmonary/Chest: Effort normal.  Musculoskeletal: Normal range of motion.  Clavicle AC and [place] non tender.  cspine no midline bony tenderness. cspine minimal discomfort with right rotation.  Minimal discomfort of upper trapezius with right rotation of the neck. Tender along anterior shoulder and bicipital groove.  Diffusely tender across entire right shoulder. ROM is significantly limited to 20-30 degrees of abduction and  flexion. NVI distally to finger tip. Radial pulse 2+.  Negative sulcus sign  Neurological: He is alert and oriented to person, place, and time.  Skin: Skin is warm and dry.  Psychiatric: He has a normal mood and affect. His behavior is normal.  Nursing note and vitals reviewed.  Vitals:   12/01/16 1213  BP: 136/88  Pulse: (!) 107  Resp: 17  Temp: 98.2 F (36.8 C)  TempSrc: Oral  SpO2: 98%  Weight: 231 lb (104.8 kg)  Height: 5' 9.5" (1.765 m)    Assessment & Plan:   Steven Cox is a 50 y.o. male Pain in joint of right shoulder - Plan: Ambulatory referral to Orthopedic Surgery  Strain of right shoulder, initial encounter - Plan: Ambulatory referral to Orthopedic Surgery  Right shoulder pain, possible RTC strain/tear  Vs labral tear with reported clicking.  Injury occurred on December 9th, 2017 as above while at work.. No fracture seen on outside imaging. Appears to be located on imaging as well as in office, but guarded exam with minimal range of motion. Reported clicking within the shoulder, differential includes labral tear, or rotator cuff tear.  -Continue sling, Robaxin, hydrocodone if needed, additive risks/side effects discussed, and refer to orthopedics. Advised if he needs medications refilled prior to ortho eval, to let me know. Paperwork completed for his job with restrictions in duty. rtc precautions.   Meds ordered this encounter  Medications  . lisinopril (PRINIVIL,ZESTRIL) 10 MG tablet    Sig: Take 10 mg by mouth daily.  Marland Kitchen. levothyroxine (SYNTHROID, LEVOTHROID) 50 MCG tablet    Sig: Take 50 mcg by mouth daily before breakfast.  . sitaGLIPtin-metformin (JANUMET) 50-1000 MG tablet    Sig: Take 1 tablet by mouth 2 (two) times daily with a meal.  . atorvastatin (LIPITOR) 40 MG tablet    Sig: Take 40 mg by mouth daily.  Marland Kitchen. aspirin 81 MG chewable tablet    Sig: Chew by mouth daily.  . methocarbamol (ROBAXIN) 500 MG tablet    Sig: Take 500 mg by mouth 4 (four) times daily.    Marland Kitchen. HYDROcodone-acetaminophen (NORCO) 10-325 MG tablet    Sig: Take 1 tablet by mouth every 6 (six) hours as needed.   Patient Instructions     Continue shoulder sling, Robaxin if needed, or hydrocodone if needed for breakthrough pain. Be  careful combining these medications as both can cause sedation. I did refer you to orthopedics, but if you are running out of your medication prior to that visit, let me know and I temporarily refill them.  Return to the clinic or go to the nearest emergency room if any of your symptoms worsen or new symptoms occur.    IF you received an x-ray today, you will receive an invoice from [facility]. Please contact [facility] at [phone] with questions or concerns regarding your invoice.   IF you received labwork today, you will receive an invoice from [facility]. Please  contact [facility] at [phone] with questions or concerns regarding your invoice.   Our billing staff will not be able to assist you with questions regarding bills from these companies.  You will be contacted with the lab results as soon as they are available. The fastest way to get your results is to activate your My Chart account. Instructions are located on the last page of this paperwork. If you have not heard from [place] regarding the results in 2 weeks, please contact this office.        I personally performed the services described in this documentation, which was scribed in my presence. The recorded information has been reviewed and considered, and addended by me as needed.   Signed,   Meredith Staggers, MD [facility].  12/01/16 8:14 PM

## 2016-12-04 ENCOUNTER — Ambulatory Visit (INDEPENDENT_AMBULATORY_CARE_PROVIDER_SITE_OTHER): Admitting: Orthopaedic Surgery

## 2016-12-04 ENCOUNTER — Ambulatory Visit (INDEPENDENT_AMBULATORY_CARE_PROVIDER_SITE_OTHER): Payer: Self-pay

## 2016-12-04 ENCOUNTER — Encounter (INDEPENDENT_AMBULATORY_CARE_PROVIDER_SITE_OTHER): Payer: Self-pay | Admitting: Orthopaedic Surgery

## 2016-12-04 DIAGNOSIS — M25511 Pain in right shoulder: Secondary | ICD-10-CM | POA: Diagnosis not present

## 2016-12-04 NOTE — Progress Notes (Signed)
   Office Visit Note   Patient: Steven Cox           Date of Birth: Jan 28, 1966           MRN: [id number] Visit Date: 12/04/2016              Requested by: Shade FloodJeffrey R Greene, MD [address] [place], [place] [address] PCP: No PCP Per Patient   Assessment & Plan: Visit Diagnoses:  1. Acute pain of right shoulder     Plan: Impression is right shoulder rotator cuff syndrome with tear of supraspinatus. Recommend MRI to fully evaluate the extent of the tear for possible surgery.  Follow-Up Instructions: No Follow-up on file.   Orders:  Orders Placed This Encounter  Procedures  . XR Shoulder Right  . MR SHOULDER RIGHT WO CONTRAST   No orders of the defined types were placed in this encounter.     Procedures: No procedures performed   Clinical Data: No additional findings.   Subjective: Chief Complaint  Patient presents with  . Right Shoulder - Pain    Patient is a 50 year old gentleman who works for the [employer] comes in today with right shoulder injury. 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 He had immediate onset of right shoulder pain that radiates down the arm. He is currently doing light duty. He is right-handed. The pain is worse with use of the arm and shoulder.    Review of Systems  Constitutional: Negative.   HENT: Negative.   Eyes: Negative.   Respiratory: Negative.   Cardiovascular: Negative.   Gastrointestinal: Negative.   Endocrine: Negative.   Genitourinary: Negative.   Musculoskeletal: Negative.   Skin: Negative.   Allergic/Immunologic: Negative.   Neurological: Negative.   Hematological: Negative.   Psychiatric/Behavioral: Negative.      Objective: Vital Signs: There were no vitals taken for this visit.  Physical Exam  Constitutional: He is oriented to person, place, and time. He appears well-developed and well-nourished.  HENT:  Head: Normocephalic and atraumatic.   Eyes: EOM are normal.  Neck: Neck supple.  Cardiovascular: Intact distal pulses.   Pulmonary/Chest: Effort normal.  Abdominal: Soft.  Neurological: He is alert and oriented to person, place, and time.  Skin: Skin is warm.  Psychiatric: He has a normal mood and affect. His behavior is normal. Judgment and thought content normal.  Nursing note and vitals reviewed.   Ortho Exam Exam of the right shoulder demonstrates positive drop arm sign and weakness with supraspinatus testing. Infraspinatus is normal strength. Acromioclavicular joint nontender. Positive cross adduction sign. Positive impingement. Specialty Comments:  No specialty comments available.  Imaging: Xr Shoulder Right  Result Date: 12/04/2016 No acute findings    PMFS History: There are no active problems to display for this patient.  Past Medical History:  Diagnosis Date  . Allergy     Family History  Problem Relation Age of Onset  . Diabetes Mother   . Diabetes Father     No past surgical history on file. Social History   Occupational History  . Not on file.   Social History Main Topics  . Smoking status: Former Smoker    Quit date: 12/04/2016  . Smokeless tobacco: Never Used  . Alcohol use Not on file  . Drug use: Unknown  . Sexual activity: Not on file

## 2016-12-25 ENCOUNTER — Other Ambulatory Visit: Payer: Self-pay

## 2016-12-30 ENCOUNTER — Other Ambulatory Visit

## 2017-01-02 ENCOUNTER — Telehealth (INDEPENDENT_AMBULATORY_CARE_PROVIDER_SITE_OTHER): Payer: Self-pay | Admitting: Orthopaedic Surgery

## 2017-01-02 NOTE — Telephone Encounter (Signed)
Pt called in and stated we needed his WC info to schedule his MRI right shoulder   He stated his claim number is [id number]  Federal employee compensation [phone] fax [phone]  Pt number is [phone] please call

## 2017-01-21 ENCOUNTER — Telehealth (INDEPENDENT_AMBULATORY_CARE_PROVIDER_SITE_OTHER): Payer: Self-pay | Admitting: *Deleted

## 2017-01-21 ENCOUNTER — Other Ambulatory Visit (INDEPENDENT_AMBULATORY_CARE_PROVIDER_SITE_OTHER): Payer: Self-pay | Admitting: Orthopaedic Surgery

## 2017-01-21 NOTE — Telephone Encounter (Signed)
[facility] at [facility] stating that pt Steven Cox has been approved for us but needs to be [facility] and needs to provider ID # of [id number] for pt to have MRI on Sunday Feb 4. Please advise!

## 2017-01-22 NOTE — Telephone Encounter (Signed)
Per email received from KunaLisa she faxed in new request with [facility]

## 2017-01-25 ENCOUNTER — Other Ambulatory Visit

## 2017-02-13 ENCOUNTER — Ambulatory Visit
Admission: RE | Admit: 2017-02-13 | Discharge: 2017-02-13 | Disposition: A | Discharge status: Home / Self Care | Source: Ambulatory Visit | Attending: Orthopaedic Surgery | Admitting: Orthopaedic Surgery

## 2017-02-13 DIAGNOSIS — M25511 Pain in right shoulder: Secondary | ICD-10-CM

## 2017-02-23 ENCOUNTER — Encounter (INDEPENDENT_AMBULATORY_CARE_PROVIDER_SITE_OTHER): Payer: Self-pay | Admitting: Orthopaedic Surgery

## 2017-02-23 ENCOUNTER — Ambulatory Visit (INDEPENDENT_AMBULATORY_CARE_PROVIDER_SITE_OTHER): Admitting: Orthopaedic Surgery

## 2017-02-23 DIAGNOSIS — M75121 Complete rotator cuff tear or rupture of right shoulder, not specified as traumatic: Secondary | ICD-10-CM

## 2017-02-23 NOTE — Progress Notes (Signed)
   Office Visit Note   Patient: Steven Cox           Date of Birth: Jul 09, 1966           MRN: [id number] Visit Date: 02/23/2017              Requested by: No referring provider defined for this encounter. PCP: No PCP Per Patient   Assessment & Plan: Visit Diagnoses:  1. Complete tear of right rotator cuff     Plan: MRI shows full thickness tear of supraspinatus, however patient's function is well compensated and he seems to be doing ok.  He mainly has problems when lifting things above head.  He's been doing light duty.  He wants to try full duty for 4 weeks and then f/u for discussion if he wants to pursue surgery or not.  Follow-Up Instructions: Return in about 4 weeks (around 03/23/2017).   Orders:  No orders of the defined types were placed in this encounter.  No orders of the defined types were placed in this encounter.     Procedures: No procedures performed   Clinical Data: No additional findings.   Subjective: Chief Complaint  Patient presents with  . Right Shoulder - Follow-up    Patient f/u today to review MRI.  He has mild pain with elevation of arm above about 145 degrees.  He's been doing light duty.  No new numbness or weakness.    Review of Systems  Constitutional: Negative.   All other systems reviewed and are negative.    Objective: Vital Signs: There were no vitals taken for this visit.  Physical Exam  Constitutional: He is oriented to person, place, and time. He appears well-developed and well-nourished.  Pulmonary/Chest: Effort normal.  Abdominal: Soft.  Neurological: He is alert and oriented to person, place, and time.  Skin: Skin is warm.  Psychiatric: He has a normal mood and affect. His behavior is normal. Judgment and thought content normal.  Nursing note and vitals reviewed.   Ortho Exam Exam stable   Specialty Comments:  No specialty comments available.  Imaging: No results found.   PMFS History: There are no active  problems to display for this patient.  Past Medical History:  Diagnosis Date  . Allergy     Family History  Problem Relation Age of Onset  . Diabetes Mother   . Diabetes Father     No past surgical history on file. Social History   Occupational History  . Not on file.   Social History Main Topics  . Smoking status: Former Smoker    Quit date: 12/04/2016  . Smokeless tobacco: Never Used  . Alcohol use Not on file  . Drug use: Unknown  . Sexual activity: Not on file

## 2017-03-30 ENCOUNTER — Encounter (INDEPENDENT_AMBULATORY_CARE_PROVIDER_SITE_OTHER): Payer: Self-pay

## 2017-03-30 ENCOUNTER — Encounter (INDEPENDENT_AMBULATORY_CARE_PROVIDER_SITE_OTHER): Payer: Self-pay | Admitting: Orthopaedic Surgery

## 2017-03-30 ENCOUNTER — Ambulatory Visit (INDEPENDENT_AMBULATORY_CARE_PROVIDER_SITE_OTHER): Admitting: Orthopaedic Surgery

## 2017-03-30 DIAGNOSIS — M75121 Complete rotator cuff tear or rupture of right shoulder, not specified as traumatic: Secondary | ICD-10-CM | POA: Diagnosis not present

## 2017-03-30 NOTE — Progress Notes (Signed)
   Office Visit Note   Patient: Steven Cox           Date of Birth: [birth year]           MRN: [id number] Visit Date: 03/30/2017              Requested by: No referring provider defined for this encounter. PCP: No PCP Per Patient   Assessment & Plan: Visit Diagnoses:  1. Complete tear of right rotator cuff     Plan: Patient has compensated quite well for his rotator cuff tear. He requests to go back to full duty at this point. I think this is very reasonable. He's done well with the last 4 weeks. I released him back to full duty permanently. Follow-up with me as needed.  Patient has reached MMI. No significant partial permanent impairment.  Follow-Up Instructions: No Follow-up on file.   Orders:  No orders of the defined types were placed in this encounter.  No orders of the defined types were placed in this encounter.     Procedures: No procedures performed   Clinical Data: No additional findings.   Subjective: Chief Complaint  Patient presents with  . Right Shoulder - Follow-up    Patient follows up today for his shoulder pain and rotator cuff tear. He's been doing full duty for the last 4 weeks has been doing fine. He just mainly has some discomfort when he is lifting something heavy away from his body or higher up. He is ready to go back to full duty Permanently.    Review of Systems  Constitutional: Negative.   All other systems reviewed and are negative.    Objective: Vital Signs: There were no vitals taken for this visit.  Physical Exam  Constitutional: He is oriented to person, place, and time. He appears well-developed and well-nourished.  Pulmonary/Chest: Effort normal.  Abdominal: Soft.  Neurological: He is alert and oriented to person, place, and time.  Skin: Skin is warm.  Psychiatric: He has a normal mood and affect. His behavior is normal. Judgment and thought content normal.  Nursing note and vitals reviewed.   Ortho Exam Right shoulder  exam shows full range of motion with good strength with mild pain with supraspinatus testing. Specialty Comments:  No specialty comments available.  Imaging: No results found.   PMFS History: There are no active problems to display for this patient.  Past Medical History:  Diagnosis Date  . Allergy     Family History  Problem Relation Age of Onset  . Diabetes Mother   . Diabetes Father     No past surgical history on file. Social History   Occupational History  . Not on file.   Social History Main Topics  . Smoking status: Former Smoker    Quit date: 12/04/2016  . Smokeless tobacco: Never Used  . Alcohol use Not on file  . Drug use: Unknown  . Sexual activity: Not on file

## 2017-08-11 ENCOUNTER — Ambulatory Visit (INDEPENDENT_AMBULATORY_CARE_PROVIDER_SITE_OTHER): Admitting: Orthopaedic Surgery

## 2017-08-11 DIAGNOSIS — M7541 Impingement syndrome of right shoulder: Secondary | ICD-10-CM | POA: Diagnosis not present

## 2017-08-11 DIAGNOSIS — M75121 Complete rotator cuff tear or rupture of right shoulder, not specified as traumatic: Secondary | ICD-10-CM

## 2017-08-11 DIAGNOSIS — M19011 Primary osteoarthritis, right shoulder: Secondary | ICD-10-CM | POA: Insufficient documentation

## 2017-08-11 NOTE — Progress Notes (Signed)
   Office Visit Note   Patient: Steven Cox           Date of Birth: January 29, 1966           MRN: [id number] Visit Date: 08/11/2017              Requested by: No referring provider defined for this encounter. PCP: Patient, No Pcp Per   Assessment & Plan: Visit Diagnoses:  1. Complete tear of right rotator cuff   2. Impingement syndrome of right shoulder   3. Arthrosis of right acromioclavicular joint     Plan: MRI of the right shoulder shows a full-thickness supraspinatus with type III acromion and acromioclavicular arthrosis. Patient has failed conservative treatment and is having difficulty and disability with ADLs. He would like to pursue arthroscopic rotator cuff repair and acromioplasty with distal clavicle excision and extensive debridement. We discussed the risks benefits alternatives to surgery and the expected recovery time. Patient understands and wishes to proceed. Total face to face encounter time was greater than 25 minutes and over half of this time was spent in counseling and/or coordination of care.  Follow-Up Instructions: Return if symptoms worsen or fail to improve.   Orders:  No orders of the defined types were placed in this encounter.  No orders of the defined types were placed in this encounter.     Procedures: No procedures performed   Clinical Data: No additional findings.   Subjective: No chief complaint on file.   Steven Cox a 51 year old gentleman comes back today for follow-up of his right shoulder pain with a complete supraspinatus tear of his right shoulder. He is having increasingly difficulty with ADLs with his right arm. He is currently working full duty. Denies any numbness and tingling.    Review of Systems  Constitutional: Negative.   All other systems reviewed and are negative.    Objective: Vital Signs: There were no vitals taken for this visit.  Physical Exam  Constitutional: He is oriented to person, place, and time. He  appears well-developed and well-nourished.  Pulmonary/Chest: Effort normal.  Abdominal: Soft.  Neurological: He is alert and oriented to person, place, and time.  Skin: Skin is warm.  Psychiatric: He has a normal mood and affect. His behavior is normal. Judgment and thought content normal.  Nursing note and vitals reviewed.   Ortho Exam Right shoulder exam shows pain with empty can testing. Positive cross adduction sign. Positive Hawkins impingement. Specialty Comments:  No specialty comments available.  Imaging: No results found.   PMFS History: Patient Active Problem List   Diagnosis Date Noted  . Complete tear of right rotator cuff 08/11/2017  . Impingement syndrome of right shoulder 08/11/2017  . Arthrosis of right acromioclavicular joint 08/11/2017   Past Medical History:  Diagnosis Date  . Allergy     Family History  Problem Relation Age of Onset  . Diabetes Mother   . Diabetes Father     No past surgical history on file. Social History   Occupational History  . Not on file.   Social History Main Topics  . Smoking status: Former Smoker    Quit date: 12/04/2016  . Smokeless tobacco: Never Used  . Alcohol use Not on file  . Drug use: Unknown  . Sexual activity: Not on file

## 2017-10-12 ENCOUNTER — Telehealth (INDEPENDENT_AMBULATORY_CARE_PROVIDER_SITE_OTHER): Payer: Self-pay | Admitting: Orthopaedic Surgery

## 2017-10-12 NOTE — Telephone Encounter (Signed)
See message.

## 2017-10-12 NOTE — Telephone Encounter (Signed)
Pt needs to be scheduled for his surgery   Please call [phone]  Pt was seen on 08/11/2017

## 2017-10-12 NOTE — Telephone Encounter (Signed)
Please schedule him. Thanks.  He should already have a blue sheet.

## 2017-10-30 NOTE — Telephone Encounter (Signed)
Per Amy Bishop, surgery still not approved by [facility].  I called and advised patient.

## 2018-02-11 ENCOUNTER — Ambulatory Visit (INDEPENDENT_AMBULATORY_CARE_PROVIDER_SITE_OTHER): Admitting: Orthopaedic Surgery

## 2018-02-11 ENCOUNTER — Encounter (INDEPENDENT_AMBULATORY_CARE_PROVIDER_SITE_OTHER): Payer: Self-pay | Admitting: Orthopaedic Surgery

## 2018-02-11 DIAGNOSIS — M7541 Impingement syndrome of right shoulder: Secondary | ICD-10-CM | POA: Diagnosis not present

## 2018-02-11 DIAGNOSIS — M75121 Complete rotator cuff tear or rupture of right shoulder, not specified as traumatic: Secondary | ICD-10-CM

## 2018-02-11 NOTE — Progress Notes (Signed)
   Office Visit Note   Patient: Steven Cox           Date of Birth: 16-Feb-1966           MRN: [id number] Visit Date: 02/11/2018              Requested by: No referring provider defined for this encounter. PCP: Patient, No Pcp Per   Assessment & Plan: Visit Diagnoses:  1. Complete tear of right rotator cuff   2. Impingement syndrome of right shoulder     Plan: Impression is right shoulder complete rotator cuff tear with impingement and AC joint arthrosis.  MRI findings were reviewed with the patient.  Again the risk benefits alternatives to surgery were discussed with the patient including the expected recovery and outcome.  He understands and wishes to proceed. Total face to face encounter time was greater than 25 minutes and over half of this time was spent in counseling and/or coordination of care.  Follow-Up Instructions: Return if symptoms worsen or fail to improve.   Orders:  No orders of the defined types were placed in this encounter.  No orders of the defined types were placed in this encounter.     Procedures: No procedures performed   Clinical Data: No additional findings.   Subjective: Chief Complaint  Patient presents with  . Right Shoulder - Pain    Steven Cox comes back today for his right shoulder rotator cuff tear and dysfunction.  He states that his surgery and claim has been approved now.  He continues to have pain and dysfunction regarding his right shoulder.  He has pain with elevation and with overhead duties.  Denies any numbness and tingling.    Review of Systems  Constitutional: Negative.   All other systems reviewed and are negative.    Objective: Vital Signs: There were no vitals taken for this visit.  Physical Exam  Constitutional: He is oriented to person, place, and time. He appears well-developed and well-nourished.  Pulmonary/Chest: Effort normal.  Abdominal: Soft.  Neurological: He is alert and oriented to person, place, and  time.  Skin: Skin is warm.  Psychiatric: He has a normal mood and affect. His behavior is normal. Judgment and thought content normal.  Nursing note and vitals reviewed.   Ortho Exam Right shoulder exam shows positive empty can test.  Positive cross adduction and positive impingement.   Specialty Comments:  No specialty comments available.  Imaging: No results found.   PMFS History: Patient Active Problem List   Diagnosis Date Noted  . Complete tear of right rotator cuff 08/11/2017  . Impingement syndrome of right shoulder 08/11/2017  . Arthrosis of right acromioclavicular joint 08/11/2017   Past Medical History:  Diagnosis Date  . Allergy     Family History  Problem Relation Age of Onset  . Diabetes Mother   . Diabetes Father     History reviewed. No pertinent surgical history. Social History   Occupational History  . Not on file  Tobacco Use  . Smoking status: Former Smoker    Last attempt to quit: 12/04/2016    Years since quitting: 1.1  . Smokeless tobacco: Never Used  Substance and Sexual Activity  . Alcohol use: Not on file  . Drug use: Not on file  . Sexual activity: Not on file
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
## 2022-09-29 NOTE — Discharge Summary (Signed)
Post-operative care after Dental/Oral Surgery  Your surgery was for: Extraction of #15, 17, 29, 31   Performed by Dr. Etter Sjogren, DDS / Burnis Kingfisher, DDS Contact # During Business Hours: [phone]   Follow-up visit date/time: PRN After-hours emergencies contact #: [phone]     General Guidelines  Do not disturb your wound.  Doing so may cause irritation, infection, and/or bleeding.  Do not smoke.  If you do smoke, try to refrain as much as possible. (This is an excellent time to quit!)  Bleeding  Rest and keep your head elevated.  Oozing of blood from the wound is normal.  Change gauze every 60 minutes until bleeding stops. Consider sleeping on a towel or old pillow in case there is any staining.  If you have worrisome bleeding, fold or roll a gauze pad and place it TIGHTLY on your surgical site.  Be sure it pushes down on the gums and not between the teeth.  Press or bite down hard on the gauze and keep firm pressure for at least 60 minutes.  If the gauze becomes soaked, DO NOT remove it, but place another gauze over it and call the paging operator as above.   It is normal for the clot to remodel in 2-3 days; bleeding may reoccur.  If this happens, repack the area with new gauze and apply firm pressure for 60 minutes.  Nausea or Vomiting  When you swallow blood, it can cause you to feel nauseated or to vomit.  Pain medicines can also cause stomach upset.  Taking pain medicines with food often helps.  If you are sick to your stomach or cannot hold down any food or water, call the pager number as above.  We do not want you to become dehydrated.  Pain and Pain Medicines  Pain after dental or oral surgery is normal.  Pain medicines help to lower and control your pain, but they usually do not completely eliminate your pain.  Expect your pain to go from intolerable (7-8 out of 10) down to tolerable (2-3 out of 10).  Do not expect zero pain.  Take your pain medicines exactly as prescribed and with food.  Do  not take more pain medicine or use other remedies without the consent of your doctor.  If you have not been given a prescription for pain medicines, an over-the-counter pain medicine should be enough.  If you find that your pain relief is not adequate, page on call dentist for after hours or call clinic phone during business hours.  Prescribed pain medication can be alternated with Ibuprofen (up to 4 times per day), unless you are unable to take Ibuprofen per MD. Do not exceed 600mg  of Ibuprofen at one time or 3200mg  in 24 hour period.   For example, take prescribed pain mediation, wait 3 hours, take Ibuprofen, wait 3 hours, take prescribed pain medication, and repeat. This is usually only needed for the first 2-3 days after procedure.   Other Medicines  Fill prescriptions as soon as possible.  Take medicines exactly as told.  For antibiotics, be sure to take the entire prescription, even if you start feeling better.  Contact the on call dentist as above if you have any side effects that cause concern.  Swelling  Swelling after dental or oral surgery is normal.  It may last 4 to 7 days.   To minimize swelling, apply an ice pack to your face over the area of your surgery as soon as possible after  surgery.  Use the ice pack for 20 minutes on then 20 minutes off.  Repeat this cycle as needed for the first 6 to 8 hours.  If the swelling is excessive, page on call dentist as above for after hours or call clinic if during normal business hours.    Post Operative Appointment  Unless otherwise specified, your treatment has been completed and you will not need to be seen again at this clinic.   If you desire a post-operative appointment to evaluate your healing please contact the clinic at [phone]. Please contact the clinic a minimum of 24 hours in advance to schedule this appointment.   Bruising  Sometimes bruises appear after surgery no matter how gentle the procedure.  They can be large and impressive.   If you get  bruises, they may take days to weeks to go away.  Sometimes bruises occur first near your surgical site and then move down into your neck and then chest by gravity.  Bruises change colors from black/blue to yellow/green as the resolve.  This is all normal.  If you have bruising that you feel needs attention, page the on call dentist as above for after hours or call clinic if during normal business hours.  Sutures (also called stitches)  If used, your sutures should dissolve and fall out in the next several days.  Some types take up to two weeks to dissolve.  Please call us if any loose sutures need to be clipped and removed.  Signs of Infection  All wounds are at risk of becoming infected.  Existing infections can get worse.  If you are prescribed an antibiotic, be sure to take it as told--even if it means getting up during the night to stay on schedule.  Be sure to take the entire prescription, even if you start feeling better.  The bacteria that survive the longest during antibiotic use are the hardest to control if they are allowed to re-grow.  Contact the on-call dentist right away if you have any of these symptoms:  Fever, over 101 F or 38.5 C.  Swelling, especially 2 to 4 days after surgery that is warm to the touch and somewhat hard instead of puffy.  Swelling in your throat/neck, or on your face that causes an eye to start to close.  You do not feel well (run down, tired, or sick).  Diet and Fluids  Drink plenty of fluids.  Try fruit juices, milk shakes, eggnog, water, or a liquid supplement like Ensure.  Eat soups or foods that can be chewed easily such as fish, cottage cheese, pasta, and eggs.  You may not feel like eating as much as usual, but it is important to eat well to promote quality healing.  Oral Hygiene  Clean your mouth well after eating and drinking.  Salt water rinses are not proven to be effective, but are better than nothing.  ( teaspoon salt to 8 ounces of warm water).  Start brushing  your teeth gently and flossing as soon as you are able.  Generally this is the day after surgery.   Toothpaste may sting; it is okay to not use toothpaste for a few days.  Instead, dip your toothbrush in warm water and clean your teeth and gums carefully.  Gently clean the surgical site, being careful not to disturb your wound or the sutures.    Other Instructions from Your Doctor:  Not indicated at this time.  Instructions prepared by Etter Sjogren, DDS  at 6:23 PM PDT  on 09/29/2022

## 2023-06-14 ENCOUNTER — Emergency Department (HOSPITAL_COMMUNITY): Payer: Self-pay

## 2023-06-14 ENCOUNTER — Emergency Department (EMERGENCY_DEPARTMENT_HOSPITAL): Payer: Medicare HMO

## 2023-06-14 ENCOUNTER — Inpatient Hospital Stay (HOSPITAL_COMMUNITY): Payer: Medicare HMO

## 2023-06-14 ENCOUNTER — Inpatient Hospital Stay
Admission: AC | Admit: 2023-06-14 | Discharge: 2023-06-29 | DRG: 964 | Disposition: A | Discharge status: Home Health | Payer: Medicare HMO | Attending: Internal Medicine | Admitting: Internal Medicine

## 2023-06-14 ENCOUNTER — Other Ambulatory Visit: Payer: Self-pay

## 2023-06-14 ENCOUNTER — Inpatient Hospital Stay (HOSPITAL_COMMUNITY): Payer: Self-pay | Admitting: Surgery

## 2023-06-14 ENCOUNTER — Encounter (HOSPITAL_COMMUNITY): Payer: Self-pay

## 2023-06-14 DIAGNOSIS — R609 Edema, unspecified: Secondary | ICD-10-CM | POA: Diagnosis present

## 2023-06-14 DIAGNOSIS — S2241XA Multiple fractures of ribs, right side, initial encounter for closed fracture: Secondary | ICD-10-CM

## 2023-06-14 DIAGNOSIS — M549 Dorsalgia, unspecified: Secondary | ICD-10-CM | POA: Diagnosis present

## 2023-06-14 DIAGNOSIS — J90 Pleural effusion, not elsewhere classified: Secondary | ICD-10-CM | POA: Diagnosis present

## 2023-06-14 DIAGNOSIS — G8929 Other chronic pain: Secondary | ICD-10-CM | POA: Diagnosis present

## 2023-06-14 DIAGNOSIS — H539 Unspecified visual disturbance: Secondary | ICD-10-CM | POA: Diagnosis not present

## 2023-06-14 DIAGNOSIS — K219 Gastro-esophageal reflux disease without esophagitis: Secondary | ICD-10-CM | POA: Diagnosis present

## 2023-06-14 DIAGNOSIS — J9811 Atelectasis: Secondary | ICD-10-CM

## 2023-06-14 DIAGNOSIS — S40011A Contusion of right shoulder, initial encounter: Secondary | ICD-10-CM | POA: Diagnosis present

## 2023-06-14 DIAGNOSIS — Z1152 Encounter for screening for COVID-19: Secondary | ICD-10-CM

## 2023-06-14 DIAGNOSIS — R0603 Acute respiratory distress: Secondary | ICD-10-CM | POA: Diagnosis present

## 2023-06-14 DIAGNOSIS — R0989 Other specified symptoms and signs involving the circulatory and respiratory systems: Secondary | ICD-10-CM

## 2023-06-14 DIAGNOSIS — M25562 Pain in left knee: Secondary | ICD-10-CM | POA: Diagnosis present

## 2023-06-14 DIAGNOSIS — W230XXA Caught, crushed, jammed, or pinched between moving objects, initial encounter: Secondary | ICD-10-CM

## 2023-06-14 DIAGNOSIS — J939 Pneumothorax, unspecified: Secondary | ICD-10-CM

## 2023-06-14 DIAGNOSIS — M778 Other enthesopathies, not elsewhere classified: Secondary | ICD-10-CM

## 2023-06-14 DIAGNOSIS — W240XXA Contact with lifting devices, not elsewhere classified, initial encounter: Secondary | ICD-10-CM | POA: Diagnosis present

## 2023-06-14 DIAGNOSIS — R918 Other nonspecific abnormal finding of lung field: Secondary | ICD-10-CM

## 2023-06-14 DIAGNOSIS — M79602 Pain in left arm: Secondary | ICD-10-CM | POA: Diagnosis present

## 2023-06-14 DIAGNOSIS — Z9151 Personal history of suicidal behavior: Secondary | ICD-10-CM

## 2023-06-14 DIAGNOSIS — Z8673 Personal history of transient ischemic attack (TIA), and cerebral infarction without residual deficits: Secondary | ICD-10-CM

## 2023-06-14 DIAGNOSIS — M4322 Fusion of spine, cervical region: Secondary | ICD-10-CM

## 2023-06-14 DIAGNOSIS — M7918 Myalgia, other site: Secondary | ICD-10-CM | POA: Diagnosis present

## 2023-06-14 DIAGNOSIS — R262 Difficulty in walking, not elsewhere classified: Secondary | ICD-10-CM | POA: Diagnosis present

## 2023-06-14 DIAGNOSIS — T40605A Adverse effect of unspecified narcotics, initial encounter: Secondary | ICD-10-CM | POA: Diagnosis not present

## 2023-06-14 DIAGNOSIS — R112 Nausea with vomiting, unspecified: Secondary | ICD-10-CM | POA: Diagnosis not present

## 2023-06-14 DIAGNOSIS — M898X8 Other specified disorders of bone, other site: Secondary | ICD-10-CM

## 2023-06-14 DIAGNOSIS — F988 Other specified behavioral and emotional disorders with onset usually occurring in childhood and adolescence: Secondary | ICD-10-CM | POA: Diagnosis present

## 2023-06-14 DIAGNOSIS — R000 Tachycardia, unspecified: Secondary | ICD-10-CM | POA: Diagnosis present

## 2023-06-14 DIAGNOSIS — R200 Anesthesia of skin: Secondary | ICD-10-CM | POA: Diagnosis present

## 2023-06-14 DIAGNOSIS — Z9911 Dependence on respirator [ventilator] status: Secondary | ICD-10-CM

## 2023-06-14 DIAGNOSIS — E8729 Other acidosis: Secondary | ICD-10-CM | POA: Diagnosis present

## 2023-06-14 DIAGNOSIS — F17210 Nicotine dependence, cigarettes, uncomplicated: Secondary | ICD-10-CM | POA: Diagnosis present

## 2023-06-14 DIAGNOSIS — E119 Type 2 diabetes mellitus without complications: Secondary | ICD-10-CM | POA: Diagnosis present

## 2023-06-14 DIAGNOSIS — R202 Paresthesia of skin: Secondary | ICD-10-CM | POA: Diagnosis present

## 2023-06-14 DIAGNOSIS — S7012XA Contusion of left thigh, initial encounter: Secondary | ICD-10-CM | POA: Diagnosis present

## 2023-06-14 DIAGNOSIS — M25512 Pain in left shoulder: Secondary | ICD-10-CM | POA: Diagnosis present

## 2023-06-14 DIAGNOSIS — Z041 Encounter for examination and observation following transport accident: Secondary | ICD-10-CM

## 2023-06-14 DIAGNOSIS — E785 Hyperlipidemia, unspecified: Secondary | ICD-10-CM | POA: Diagnosis present

## 2023-06-14 DIAGNOSIS — Z79891 Long term (current) use of opiate analgesic: Secondary | ICD-10-CM

## 2023-06-14 DIAGNOSIS — I10 Essential (primary) hypertension: Secondary | ICD-10-CM | POA: Diagnosis present

## 2023-06-14 DIAGNOSIS — M79601 Pain in right arm: Secondary | ICD-10-CM | POA: Diagnosis present

## 2023-06-14 DIAGNOSIS — F1190 Opioid use, unspecified, uncomplicated: Secondary | ICD-10-CM | POA: Diagnosis present

## 2023-06-14 DIAGNOSIS — E11319 Type 2 diabetes mellitus with unspecified diabetic retinopathy without macular edema: Secondary | ICD-10-CM | POA: Diagnosis present

## 2023-06-14 DIAGNOSIS — Z794 Long term (current) use of insulin: Secondary | ICD-10-CM

## 2023-06-14 DIAGNOSIS — E1165 Type 2 diabetes mellitus with hyperglycemia: Secondary | ICD-10-CM | POA: Diagnosis present

## 2023-06-14 DIAGNOSIS — Z8619 Personal history of other infectious and parasitic diseases: Secondary | ICD-10-CM

## 2023-06-14 DIAGNOSIS — M47814 Spondylosis without myelopathy or radiculopathy, thoracic region: Secondary | ICD-10-CM

## 2023-06-14 DIAGNOSIS — Z7984 Long term (current) use of oral hypoglycemic drugs: Secondary | ICD-10-CM

## 2023-06-14 DIAGNOSIS — Z981 Arthrodesis status: Secondary | ICD-10-CM

## 2023-06-14 DIAGNOSIS — M25521 Pain in right elbow: Secondary | ICD-10-CM | POA: Diagnosis present

## 2023-06-14 DIAGNOSIS — Z0489 Encounter for examination and observation for other specified reasons: Secondary | ICD-10-CM

## 2023-06-14 DIAGNOSIS — M76892 Other specified enthesopathies of left lower limb, excluding foot: Secondary | ICD-10-CM

## 2023-06-14 DIAGNOSIS — S270XXA Traumatic pneumothorax, initial encounter: Secondary | ICD-10-CM | POA: Diagnosis present

## 2023-06-14 DIAGNOSIS — M76891 Other specified enthesopathies of right lower limb, excluding foot: Secondary | ICD-10-CM

## 2023-06-14 DIAGNOSIS — J9600 Acute respiratory failure, unspecified whether with hypoxia or hypercapnia: Secondary | ICD-10-CM

## 2023-06-14 DIAGNOSIS — S280XXA Crushed chest, initial encounter: Principal | ICD-10-CM | POA: Diagnosis present

## 2023-06-14 DIAGNOSIS — S2243XA Multiple fractures of ribs, bilateral, initial encounter for closed fracture: Secondary | ICD-10-CM | POA: Diagnosis present

## 2023-06-14 DIAGNOSIS — T1490XA Injury, unspecified, initial encounter: Secondary | ICD-10-CM

## 2023-06-14 DIAGNOSIS — R936 Abnormal findings on diagnostic imaging of limbs: Secondary | ICD-10-CM

## 2023-06-14 DIAGNOSIS — M542 Cervicalgia: Secondary | ICD-10-CM | POA: Diagnosis present

## 2023-06-14 DIAGNOSIS — R0789 Other chest pain: Secondary | ICD-10-CM | POA: Diagnosis not present

## 2023-06-14 DIAGNOSIS — F329 Major depressive disorder, single episode, unspecified: Secondary | ICD-10-CM | POA: Diagnosis present

## 2023-06-14 LAB — TRAUMA PANEL
Alcohol (Ethyl): NEGATIVE mg/dL
Anion Gap: 9 (ref 4–12)
Calcium: 9.1 mg/dL (ref 8.9–10.2)
Carbon Dioxide, Total: 28 meq/L (ref 22–32)
Chloride: 99 meq/L (ref 98–108)
Creatinine: 1.03 mg/dL (ref 0.51–1.18)
Glucose: 231 mg/dL — ABNORMAL HIGH (ref 62–125)
Hematocrit: 49 % (ref 38.0–50.0)
Hemoglobin: 16.6 g/dL (ref 13.0–18.0)
Lipase: 29 U/L (ref <70)
MCH: 29.4 pg (ref 27.3–33.6)
MCHC: 33.7 g/dL (ref 32.2–36.5)
MCV: 87 fL (ref 81–98)
Partial Thromboplastin Time: 24 s (ref 22–35)
Platelet Count: 253 10*3/uL (ref 150–400)
Potassium: 4.2 meq/L (ref 3.6–5.2)
Prothrombin INR: 1.1 (ref 0.8–1.3)
Prothrombin Time Patient: 14.2 s (ref 10.7–15.6)
RBC: 5.64 10*6/uL — ABNORMAL HIGH (ref 4.40–5.60)
RDW-CV: 11.3 % (ref 11.0–14.5)
Sodium: 136 meq/L (ref 135–145)
Urea Nitrogen: 17 mg/dL (ref 8–21)
WBC: 14.13 10*3/uL — ABNORMAL HIGH (ref 4.3–10.0)
eGFR by CKD-EPI 2021: >60 mL/min/{1.73_m2} (ref >59)

## 2023-06-14 LAB — CODE PANEL, ARTERIAL, W/ LACT, ICA, NA, K, GLU, HBCO, MET HGB
Base Deficit Blood, ART: 0.7 meq/L (ref 0.0–2.0)
Bicarbonate: 27 meq/L — ABNORMAL HIGH (ref 22–26)
Calcium (Ionized): 1.19 mmol/L (ref 1.18–1.38)
Carboxyhemoglobin, ART: <0.4 %
Glucose: 227 mg/dL — ABNORMAL HIGH (ref 62–125)
Hemoglobin: 16.7 g/dL (ref 13.0–18.0)
L Lactate (Direct), ART WB: 2.4 mmol/L — ABNORMAL HIGH (ref 0.4–1.0)
Methemoglobin, ART: 0.2 % (ref <3.0)
O2 Content: 23.6 VOL% — ABNORMAL HIGH (ref 15–23)
O2 Sat (Frac.), ART: 99 % (ref 94–99)
O2 Sat (Func.), ART: 99 %
Potassium: 4.1 meq/L (ref 3.7–5.2)
Sodium: 137 meq/L (ref 136–145)
Total Hemoglobin, ART: 16.7 g/dL (ref 13.0–18.0)
pCO2, ART: 57 mmHg — ABNORMAL HIGH (ref 33–48)
pH, ART: 7.29 — ABNORMAL LOW (ref 7.35–7.45)
pO2: 244 mmHg — ABNORMAL HIGH (ref 70–95)

## 2023-06-14 LAB — BLOOD GAS, VENOUS, W/ ICA, NA, K, LACT, GLU
Base Excess, Blood, VEN: 4.4 meq/L — ABNORMAL HIGH (ref 0.0–3.0)
Bicarbonate, VEN: 29 meq/L — ABNORMAL HIGH (ref 23–27)
Calcium (Ionized): 1.14 mmol/L — ABNORMAL LOW (ref 1.18–1.38)
Glucose: 194 mg/dL — ABNORMAL HIGH (ref 62–125)
Hemoglobin: 15.2 g/dL (ref 13.0–18.0)
L Lactate (Direct), Venous Whole Blood: 1.9 mmol/L (ref 0.6–1.9)
O2 Saturation, VEN: 63 % — ABNORMAL LOW (ref 70–75)
Potassium: 3.9 meq/L (ref 3.7–5.2)
Sodium: 137 meq/L (ref 136–145)
pCO2, VEN: 41 mmHg — ABNORMAL LOW (ref 42–50)
pH, VEN: 7.46 — ABNORMAL HIGH (ref 7.32–7.40)
pO2, VEN: 30 mmHg — ABNORMAL LOW (ref 35–40)

## 2023-06-14 LAB — STANDARD DRUG SCREEN, URN
Acetaminophen Qualitative, URN: NEGATIVE
Alcohol (Ethyl), URN: NEGATIVE mg/dL
Amphet/Methamphetamine Qual,URN: NEGATIVE
Barbiturate (Qual), URN: NEGATIVE
Benzodiazepines (Qual), URN: POSITIVE — AB
Cannabinoids (Qual), URN: NEGATIVE
Cocaine (Qual), URN: NEGATIVE
Fentanyl Qual, URN: NEGATIVE
Methadone (Qual), URN: NEGATIVE
Opiates (Qual), URN: POSITIVE — AB
Phencyclidine (Qual), URN: NEGATIVE
Tricyclic Antidepressants, URN: NEGATIVE

## 2023-06-14 LAB — URINALYSIS WITH REFLEX CULTURE
Bacteria, URN: NONE SEEN
Bilirubin (Qual), URN: NEGATIVE
Epith Cells_Renal/Trans,URN: NEGATIVE /HPF
Epith Cells_Squamous, URN: NEGATIVE /LPF
Ketones, URN: NEGATIVE
Leukocyte Esterase, URN: NEGATIVE
Nitrite, URN: NEGATIVE
Occult Blood, URN: NEGATIVE
Protein (Alb Semiquant), URN: NEGATIVE
RBC, URN: NEGATIVE /HPF
Specific Gravity, URN: 1.022 g/mL (ref 1.006–1.027)
WBC, URN: NEGATIVE /HPF
pH, URN: 6.5 (ref 5.0–8.0)

## 2023-06-14 LAB — LAB ADD ON ORDER

## 2023-06-14 LAB — TROPONIN_I
Troponin_I Interpretation: NORMAL
Troponin_I: <0.03 ng/mL (ref <0.04)

## 2023-06-14 LAB — BLOOD GAS, ARTERIAL, W/ ICA, NA, K, LACT, GLU
Base Excess Blood, ART: 3.9 meq/L — ABNORMAL HIGH (ref 0.0–3.0)
Bicarbonate: 25 meq/L (ref 22–26)
Calcium (Ionized): 1.12 mmol/L — ABNORMAL LOW (ref 1.18–1.38)
Glucose: 164 mg/dL — ABNORMAL HIGH (ref 62–125)
Hemoglobin: 15.3 g/dL (ref 13.0–18.0)
L Lactate (Direct), ART WB: 1.3 mmol/L — ABNORMAL HIGH (ref 0.4–1.0)
O2 Saturation: 99 % (ref 95–99)
Potassium: 3.2 meq/L — ABNORMAL LOW (ref 3.7–5.2)
Sodium: 136 meq/L (ref 136–145)
pCO2, ART: 29 mmHg — ABNORMAL LOW (ref 33–48)
pH, ART: 7.55 — ABNORMAL HIGH (ref 7.35–7.45)
pO2: 184 mmHg — ABNORMAL HIGH (ref 70–95)

## 2023-06-14 LAB — PREPARE PLASMA

## 2023-06-14 LAB — GLUCOSE POC, HMC: Glucose (POC): 142 mg/dL — ABNORMAL HIGH (ref 62–125)

## 2023-06-14 LAB — CK, CREATINE KINASE, TOTAL ACTIVITY: Creatine Kinase Total Activity: 466 U/L — ABNORMAL HIGH (ref 62–325)

## 2023-06-14 MED ORDER — PROPOFOL 1000 MG/100ML IV EMUL
INTRAVENOUS | Status: AC
Start: 2023-06-14 — End: 2023-06-15
  Filled 2023-06-14: qty 100

## 2023-06-14 MED ORDER — METHOCARBAMOL 500 MG OR TABS
500.0000 mg | ORAL_TABLET | Freq: Four times a day (QID) | ORAL | Status: DC | PRN
Start: 2023-06-14 — End: 2023-06-20
  Administered 2023-06-15 – 2023-06-19 (×8): 500 mg via ORAL
  Filled 2023-06-14 (×8): qty 1

## 2023-06-14 MED ORDER — POLYETHYLENE GLYCOL 3350 17 G OR PACK
17.0000 g | PACK | Freq: Every day | ORAL | Status: DC
Start: 2023-06-15 — End: 2023-06-29
  Administered 2023-06-15 – 2023-06-29 (×13): 17 g via ORAL
  Filled 2023-06-14 (×13): qty 1

## 2023-06-14 MED ORDER — FENTANYL CITRATE (PF) 100 MCG/2ML IJ SOLN
INTRAMUSCULAR | Status: AC
Start: 2023-06-14 — End: 2023-06-14
  Administered 2023-06-14: 50 ug via INTRAVENOUS
  Filled 2023-06-14: qty 2

## 2023-06-14 MED ORDER — MIDAZOLAM HCL (PF) 2 MG/2ML IJ SOLN
1.0000 mg | INTRAMUSCULAR | Status: DC | PRN
Start: 2023-06-14 — End: 2023-06-14
  Filled 2023-06-14 (×2): qty 2

## 2023-06-14 MED ORDER — MIDAZOLAM HCL (PF) 2 MG/2ML IJ SOLN
INTRAMUSCULAR | Status: AC
Start: 2023-06-14 — End: 2023-06-14
  Administered 2023-06-14: 2 mg via INTRAVENOUS
  Filled 2023-06-14: qty 4

## 2023-06-14 MED ORDER — ACETAMINOPHEN 500 MG OR TABS
1000.0000 mg | ORAL_TABLET | Freq: Four times a day (QID) | ORAL | Status: DC
Start: 2023-06-15 — End: 2023-06-29
  Administered 2023-06-15 – 2023-06-29 (×59): 1000 mg via ORAL
  Filled 2023-06-14 (×58): qty 2

## 2023-06-14 MED ORDER — DEXTROSE 50 % IV SOLN
50.0000 mL | INTRAVENOUS | Status: DC | PRN
Start: 2023-06-14 — End: 2023-06-15

## 2023-06-14 MED ORDER — PROPOFOL 1000 MG/100ML IV EMUL
0.0000 ug/kg/min | INTRAVENOUS | Status: DC
Start: 2023-06-14 — End: 2023-06-14
  Administered 2023-06-14: 20 ug/kg/min via INTRAVENOUS
  Administered 2023-06-14: 10 ug/kg/min via INTRAVENOUS

## 2023-06-14 MED ORDER — INSULIN REGULAR 100 UNITS IN NS 100 ML INFUSION (PKG PREMIX)
0.0000 [IU]/h | INJECTION | Status: DC
Start: 2023-06-14 — End: 2023-06-15

## 2023-06-14 MED ORDER — FENTANYL CITRATE (PF) 100 MCG/2ML IJ SOLN
25.0000 ug | INTRAMUSCULAR | Status: DC | PRN
Start: 2023-06-14 — End: 2023-06-14

## 2023-06-14 MED ORDER — CALCIUM GLUCONATE-NACL 2-0.675 GM/100ML-% IV SOLN
2.0000 g | INTRAVENOUS | Status: DC | PRN
Start: 2023-06-14 — End: 2023-06-19

## 2023-06-14 MED ORDER — POTASSIUM PHOSPHATE 40 MEQ IN NS 259 ML IVPB (PKG PREMIX)
40.0000 meq | INJECTION | INTRAMUSCULAR | Status: DC | PRN
Start: 2023-06-14 — End: 2023-06-19

## 2023-06-14 MED ORDER — DEXTROSE 50 % IV SOLN
25.0000 mL | INTRAVENOUS | Status: DC | PRN
Start: 2023-06-14 — End: 2023-06-15

## 2023-06-14 MED ORDER — GLUCAGON HCL (DIAGNOSTIC) 1 MG IJ SOLR
1.0000 mg | INTRAMUSCULAR | Status: DC | PRN
Start: 2023-06-14 — End: 2023-06-15

## 2023-06-14 MED ORDER — DEXTROSE IN LACTATED RINGERS 5 % IV SOLN
50.0000 mL/h | INTRAVENOUS | Status: DC | PRN
Start: 2023-06-14 — End: 2023-06-15

## 2023-06-14 MED ORDER — MIDAZOLAM HCL (PF) 2 MG/2ML IJ SOLN
1.0000 mg | INTRAMUSCULAR | Status: DC | PRN
Start: 2023-06-14 — End: 2023-06-14
  Administered 2023-06-14 (×4): 2 mg via INTRAVENOUS
  Filled 2023-06-14: qty 2

## 2023-06-14 MED ORDER — IOHEXOL 350 MG/ML IV SOLN
120.0000 mL | Freq: Once | INTRAVENOUS | Status: AC | PRN
Start: 2023-06-14 — End: 2023-06-14
  Administered 2023-06-14: 120 mL via INTRAVENOUS

## 2023-06-14 MED ORDER — FENTANYL CITRATE (PF) 100 MCG/2ML IJ SOLN
25.0000 ug | INTRAMUSCULAR | Status: DC | PRN
Start: 2023-06-14 — End: 2023-06-14
  Administered 2023-06-14: 50 ug via INTRAVENOUS

## 2023-06-14 MED ORDER — SENNOSIDES 8.6 MG OR TABS
17.2000 mg | ORAL_TABLET | Freq: Two times a day (BID) | ORAL | Status: DC
Start: 2023-06-14 — End: 2023-06-29
  Administered 2023-06-15 – 2023-06-29 (×25): 17.2 mg via ORAL
  Filled 2023-06-14 (×27): qty 2

## 2023-06-14 MED ORDER — FENTANYL CITRATE (PF) 100 MCG/2ML IJ SOLN
25.0000 ug | INTRAMUSCULAR | Status: DC | PRN
Start: 2023-06-14 — End: 2023-06-14
  Administered 2023-06-14 (×4): 50 ug via INTRAVENOUS
  Filled 2023-06-14 (×3): qty 2

## 2023-06-14 MED ORDER — FENTANYL CITRATE (PF) 100 MCG/2ML IJ SOLN
12.5000 ug | INTRAMUSCULAR | Status: DC | PRN
Start: 2023-06-14 — End: 2023-06-14

## 2023-06-14 MED ORDER — HYDROMORPHONE HCL 1 MG/ML IJ SOLN
0.2000 mg | INTRAMUSCULAR | Status: DC | PRN
Start: 2023-06-14 — End: 2023-06-15
  Administered 2023-06-15: 1 mg via INTRAVENOUS
  Administered 2023-06-15: 0.5 mg via INTRAVENOUS
  Administered 2023-06-15: 1 mg via INTRAVENOUS
  Administered 2023-06-15: 0.5 mg via INTRAVENOUS
  Filled 2023-06-14 (×4): qty 1

## 2023-06-14 MED ORDER — PROPOFOL 1000 MG/100ML IV EMUL
0.0000 ug/kg/min | INTRAVENOUS | Status: DC
Start: 2023-06-14 — End: 2023-06-15
  Administered 2023-06-15: 20 ug/kg/min via INTRAVENOUS
  Filled 2023-06-14: qty 100

## 2023-06-14 MED ORDER — MIDAZOLAM HCL (PF) 2 MG/2ML IJ SOLN
1.0000 mg | INTRAMUSCULAR | Status: DC | PRN
Start: 2023-06-14 — End: 2023-06-14
  Administered 2023-06-14: 2 mg via INTRAVENOUS
  Filled 2023-06-14: qty 2

## 2023-06-14 MED ORDER — SODIUM PHOSPHATES 45 MMOLE/15ML IV SOLN
40.0000 meq | INTRAVENOUS | Status: DC | PRN
Start: 2023-06-14 — End: 2023-06-19

## 2023-06-14 MED ORDER — ONDANSETRON HCL 4 MG OR TABS
4.0000 mg | ORAL_TABLET | Freq: Three times a day (TID) | ORAL | Status: DC | PRN
Start: 2023-06-14 — End: 2023-06-15

## 2023-06-14 MED ORDER — LIDOCAINE 4 % EX PTCH
1.0000 | MEDICATED_PATCH | CUTANEOUS | Status: DC
Start: 2023-06-14 — End: 2023-06-29
  Administered 2023-06-15 – 2023-06-28 (×9): 1 via TRANSDERMAL
  Filled 2023-06-14 (×9): qty 1

## 2023-06-14 MED ORDER — ENOXAPARIN SODIUM 30 MG/0.3ML IJ SOSY
30.0000 mg | PREFILLED_SYRINGE | Freq: Two times a day (BID) | INTRAMUSCULAR | Status: DC
Start: 2023-06-14 — End: 2023-06-29
  Administered 2023-06-15 – 2023-06-29 (×30): 30 mg via SUBCUTANEOUS
  Filled 2023-06-14 (×29): qty 0.3

## 2023-06-14 MED ORDER — ONDANSETRON HCL 4 MG/2ML IJ SOLN
4.0000 mg | Freq: Three times a day (TID) | INTRAMUSCULAR | Status: DC | PRN
Start: 2023-06-14 — End: 2023-06-15
  Administered 2023-06-15: 4 mg via INTRAVENOUS
  Filled 2023-06-14: qty 2

## 2023-06-14 MED ORDER — PROPOFOL BOLUS FROM PUMP
10.0000 mg | Status: DC | PRN
Start: 2023-06-14 — End: 2023-06-15
  Administered 2023-06-14: 20 mg via INTRAVENOUS
  Administered 2023-06-14: 10 mg via INTRAVENOUS
  Administered 2023-06-15: 20 mg via INTRAVENOUS

## 2023-06-14 MED ORDER — GLUCAGON HCL (DIAGNOSTIC) 1 MG IJ SOLR
0.5000 mg | INTRAMUSCULAR | Status: DC | PRN
Start: 2023-06-14 — End: 2023-06-15

## 2023-06-14 MED ORDER — PROPOFOL BOLUS FROM PUMP
10.0000 mg | Status: DC | PRN
Start: 2023-06-14 — End: 2023-06-14
  Administered 2023-06-14 (×3): 20 mg via INTRAVENOUS

## 2023-06-14 MED ORDER — OXYCODONE HCL 5 MG OR TABS
5.0000 mg | ORAL_TABLET | ORAL | Status: DC | PRN
Start: 2023-06-14 — End: 2023-06-15
  Administered 2023-06-15 (×2): 10 mg via ORAL
  Filled 2023-06-14 (×2): qty 2

## 2023-06-14 MED ORDER — MAGNESIUM SULFATE 2 GM/50ML SWFI IV SOLN
2.0000 g | INTRAVENOUS | Status: DC | PRN
Start: 2023-06-14 — End: 2023-06-19

## 2023-06-14 MED ORDER — POTASSIUM CHLORIDE 40 MEQ IN NS 270 ML IVPB (PKG PREMIX)
40.0000 meq | INJECTION | INTRAMUSCULAR | Status: DC | PRN
Start: 2023-06-14 — End: 2023-06-19
  Administered 2023-06-15: 40 meq via INTRAVENOUS
  Filled 2023-06-14: qty 40
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
## 2023-06-14 NOTE — ED Notes (Signed)
Assumed care from day shift. Patient imaging complete, back to room. Propofol started, BP tolerating. Foley/OG placed. Family at bedside. Imaging shows multiple bilateral rib fx, very small pneumo. Plan for TICU, hopefully extubate tomorrow. Awaiting inpatient bed assignment.     Patrica Duel, RN  06/14/23 2028
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
## 2023-06-14 NOTE — ED Provider Notes (Signed)
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 total of (30-74 minutes) 32 minutes personally providing critical care to [place]. This does not include time spent teaching or performing any separately billable procedures.     Bethann Berkshire, DO  Attending Physician  Emergency Medicine                Carlos Halsted, MD  Resident  06/14/23 2019       Domingo Cocking, [place]  06/16/23 1725
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
## 2023-06-15 ENCOUNTER — Inpatient Hospital Stay (HOSPITAL_COMMUNITY): Payer: Medicare HMO

## 2023-06-15 DIAGNOSIS — R9431 Abnormal electrocardiogram [ECG] [EKG]: Secondary | ICD-10-CM

## 2023-06-15 DIAGNOSIS — J9601 Acute respiratory failure with hypoxia: Secondary | ICD-10-CM

## 2023-06-15 DIAGNOSIS — G8911 Acute pain due to trauma: Secondary | ICD-10-CM

## 2023-06-15 DIAGNOSIS — Z4659 Encounter for fitting and adjustment of other gastrointestinal appliance and device: Secondary | ICD-10-CM

## 2023-06-15 DIAGNOSIS — J9811 Atelectasis: Secondary | ICD-10-CM

## 2023-06-15 DIAGNOSIS — Z79891 Long term (current) use of opiate analgesic: Secondary | ICD-10-CM

## 2023-06-15 DIAGNOSIS — J90 Pleural effusion, not elsewhere classified: Secondary | ICD-10-CM

## 2023-06-15 LAB — BASIC METABOLIC PANEL
Anion Gap: 8 (ref 4–12)
Calcium: 8.4 mg/dL — ABNORMAL LOW (ref 8.9–10.2)
Carbon Dioxide, Total: 25 meq/L (ref 22–32)
Chloride: 106 meq/L (ref 98–108)
Creatinine: 1.17 mg/dL (ref 0.51–1.18)
Glucose: 123 mg/dL (ref 62–125)
Potassium: 4.1 meq/L (ref 3.6–5.2)
Sodium: 139 meq/L (ref 135–145)
Urea Nitrogen: 17 mg/dL (ref 8–21)
eGFR by CKD-EPI 2021: >60 mL/min/{1.73_m2} (ref >59)

## 2023-06-15 LAB — BLOOD GAS, ARTERIAL (NO ELECTROLYTES)
Base Deficit Blood, ART: 0.7 meq/L (ref 0.0–2.0)
Base Excess Blood, ART: 3.2 meq/L — ABNORMAL HIGH (ref 0.0–3.0)
Base Excess Blood, ART: 6.5 meq/L — ABNORMAL HIGH (ref 0.0–3.0)
Bicarbonate: 25 meq/L (ref 22–26)
Bicarbonate: 26 meq/L (ref 22–26)
Bicarbonate: 28 meq/L — ABNORMAL HIGH (ref 22–26)
O2 Saturation: 98 % (ref 95–99)
O2 Saturation: 99 % (ref 95–99)
O2 Saturation: 99 % (ref 95–99)
pCO2, ART: 30 mmHg — ABNORMAL LOW (ref 33–48)
pCO2, ART: 33 mmHg (ref 33–48)
pCO2, ART: 42 mmHg (ref 33–48)
pH, ART: 7.38 (ref 7.35–7.45)
pH, ART: 7.5 — ABNORMAL HIGH (ref 7.35–7.45)
pH, ART: 7.58 — ABNORMAL HIGH (ref 7.35–7.45)
pO2: 108 mmHg — ABNORMAL HIGH (ref 70–95)
pO2: 119 mmHg — ABNORMAL HIGH (ref 70–95)
pO2: 93 mmHg (ref 70–95)

## 2023-06-15 LAB — TYPE AND SCREEN
ISBT Product Blood type: 5100
Product Issue Date/Time: 202406231831
Unit Division: 0

## 2023-06-15 LAB — GLUCOSE POC, HMC
Glucose (POC): 124 mg/dL (ref 62–125)
Glucose (POC): 138 mg/dL — ABNORMAL HIGH (ref 62–125)
Glucose (POC): 165 mg/dL — ABNORMAL HIGH (ref 62–125)
Glucose (POC): 186 mg/dL — ABNORMAL HIGH (ref 62–125)
Glucose (POC): 203 mg/dL — ABNORMAL HIGH (ref 62–125)

## 2023-06-15 LAB — MAGNESIUM: Magnesium: 1.9 mg/dL (ref 1.8–2.4)

## 2023-06-15 LAB — PREPARE PLASMA
Expiration Date of Blood Product: 202406272359
ISBT Product Blood type: 8400
Product Issue Date/Time: 202406231831
Unit Type and Rh: AB POS
Units Ordered: 1
Volume Required: 0 mL

## 2023-06-15 LAB — CBC (HEMOGRAM)
Hematocrit: 40 % (ref 38.0–50.0)
Hemoglobin: 13.8 g/dL (ref 13.0–18.0)
MCH: 29.4 pg (ref 27.3–33.6)
MCHC: 34.9 g/dL (ref 32.2–36.5)
MCV: 84 fL (ref 81–98)
Platelet Count: 217 10*3/uL (ref 150–400)
RBC: 4.69 10*6/uL (ref 4.40–5.60)
RDW-CV: 12 % (ref 11.0–14.5)
WBC: 11.96 10*3/uL — ABNORMAL HIGH (ref 4.3–10.0)

## 2023-06-15 LAB — EKG 12 LEAD
Atrial Rate: 107 {beats}/min
P Axis: 73 degrees
P-R Interval: 162 ms
Q-T Interval: 344 ms
QRS Duration: 100 ms
QTC Calculation: 459 ms
R Axis: 267 degrees
T Axis: 66 degrees
Ventricular Rate: 107 {beats}/min

## 2023-06-15 LAB — PREPARE RBC: Units Ordered: 1

## 2023-06-15 LAB — BLOOD TYPE: ABO/Rh: O POS

## 2023-06-15 LAB — PHOSPHATE: Phosphate: 3 mg/dL (ref 2.5–4.5)

## 2023-06-15 MED ORDER — GLUCAGON HCL (DIAGNOSTIC) 1 MG IJ SOLR
0.5000 mg | INTRAMUSCULAR | Status: DC | PRN
Start: 2023-06-15 — End: 2023-06-17

## 2023-06-15 MED ORDER — ATORVASTATIN CALCIUM 40 MG OR TABS
40.0000 mg | ORAL_TABLET | Freq: Every day | ORAL | Status: DC
Start: 2023-06-15 — End: 2023-06-16
  Administered 2023-06-15: 40 mg via ORAL
  Filled 2023-06-15: qty 1

## 2023-06-15 MED ORDER — INSULIN GLARGINE 100 UNIT/ML SC SOLN VIAL WRAPPER
23.0000 [IU] | Freq: Every morning | SUBCUTANEOUS | Status: DC
Start: 2023-06-16 — End: 2023-06-17
  Administered 2023-06-16 – 2023-06-17 (×2): 23 [IU] via SUBCUTANEOUS
  Filled 2023-06-15 (×2): qty 23

## 2023-06-15 MED ORDER — HYDROMORPHONE 1 MG/ML PCA CLINICIAN BOLUS FROM PUMP
0.3000 mg | INTRAVENOUS | Status: DC | PRN
Start: 2023-06-15 — End: 2023-06-16
  Administered 2023-06-15 – 2023-06-16 (×5): 0.3 mg via INTRAVENOUS

## 2023-06-15 MED ORDER — PANTOPRAZOLE SODIUM 40 MG IV SOLR
40.0000 mg | Freq: Every day | INTRAVENOUS | Status: DC
Start: 2023-06-15 — End: 2023-06-16
  Administered 2023-06-15 – 2023-06-16 (×2): 40 mg via INTRAVENOUS
  Filled 2023-06-15 (×2): qty 10

## 2023-06-15 MED ORDER — KETAMINE 250 MG IN NS 250 ML INFUSION (PKG PREMIX)
8.0000 mg/h | Status: AC
Start: 2023-06-15 — End: 2023-06-20
  Administered 2023-06-15 – 2023-06-17 (×2): 8 mg/h via INTRAVENOUS
  Administered 2023-06-18: 15 mg/h via INTRAVENOUS
  Administered 2023-06-19: 8 mg/h via INTRAVENOUS
  Administered 2023-06-19: 15 mg/h via INTRAVENOUS
  Filled 2023-06-15 (×5): qty 250

## 2023-06-15 MED ORDER — NALOXONE HCL 0.4 MG/ML IJ SOLN
0.0800 mg | INTRAMUSCULAR | Status: DC | PRN
Start: 2023-06-15 — End: 2023-06-16

## 2023-06-15 MED ORDER — MELATONIN 3 MG OR TABS
6.0000 mg | ORAL_TABLET | Freq: Every evening | ORAL | Status: DC | PRN
Start: 2023-06-15 — End: 2023-06-29
  Administered 2023-06-15 – 2023-06-26 (×4): 6 mg via ORAL
  Filled 2023-06-15 (×4): qty 2

## 2023-06-15 MED ORDER — HYDROMORPHONE 1 MG/ML IN NS 30 ML PCA SYRINGE
PREFILLED_SYRINGE | INTRAVENOUS | Status: DC
Start: 2023-06-15 — End: 2023-06-16
  Filled 2023-06-15: qty 30

## 2023-06-15 MED ORDER — ONDANSETRON HCL 4 MG/2ML IJ SOLN
4.0000 mg | Freq: Four times a day (QID) | INTRAMUSCULAR | Status: DC | PRN
Start: 2023-06-15 — End: 2023-06-16
  Administered 2023-06-15 – 2023-06-16 (×3): 4 mg via INTRAVENOUS
  Filled 2023-06-15 (×3): qty 2

## 2023-06-15 MED ORDER — OXYCODONE HCL 5 MG OR TABS
5.0000 mg | ORAL_TABLET | ORAL | Status: DC | PRN
Start: 2023-06-15 — End: 2023-06-15
  Administered 2023-06-15: 10 mg via ORAL
  Filled 2023-06-15: qty 2

## 2023-06-15 MED ORDER — INSULIN LISPRO 100 UNIT/ML IJ SOLN
0.0000 [IU] | Freq: Three times a day (TID) | INTRAMUSCULAR | Status: DC
Start: 2023-06-15 — End: 2023-06-18
  Administered 2023-06-15: 2 [IU] via SUBCUTANEOUS
  Administered 2023-06-16: 5 [IU] via SUBCUTANEOUS
  Administered 2023-06-16: 8 [IU] via SUBCUTANEOUS
  Administered 2023-06-16 – 2023-06-17 (×3): 3 [IU] via SUBCUTANEOUS
  Administered 2023-06-17: 7 [IU] via SUBCUTANEOUS
  Administered 2023-06-18: 5 [IU] via SUBCUTANEOUS
  Administered 2023-06-18: 8 [IU] via SUBCUTANEOUS
  Administered 2023-06-18: 7 [IU] via SUBCUTANEOUS
  Filled 2023-06-15: qty 3
  Filled 2023-06-15: qty 7
  Filled 2023-06-15: qty 3
  Filled 2023-06-15: qty 2
  Filled 2023-06-15: qty 8
  Filled 2023-06-15: qty 5
  Filled 2023-06-15: qty 7
  Filled 2023-06-15: qty 5
  Filled 2023-06-15: qty 3
  Filled 2023-06-15: qty 8

## 2023-06-15 MED ORDER — HYDROMORPHONE HCL 1 MG/ML IJ SOLN
0.5000 mg | Freq: Once | INTRAMUSCULAR | Status: AC
Start: 2023-06-15 — End: 2023-06-15
  Administered 2023-06-15: 0.5 mg via INTRAVENOUS
  Filled 2023-06-15: qty 1

## 2023-06-15 MED ORDER — INSULIN LISPRO 100 UNIT/ML IJ SOLN
0.0000 [IU] | Freq: Every evening | INTRAMUSCULAR | Status: DC
Start: 2023-06-15 — End: 2023-06-18
  Administered 2023-06-16 – 2023-06-17 (×2): 3 [IU] via SUBCUTANEOUS
  Filled 2023-06-15 (×2): qty 3

## 2023-06-15 MED ORDER — OXYCODONE HCL 5 MG OR TABS
5.0000 mg | ORAL_TABLET | ORAL | Status: DC | PRN
Start: 2023-06-15 — End: 2023-06-15

## 2023-06-15 MED ORDER — HYDROMORPHONE HCL 1 MG/ML IJ SOLN
1.0000 mg | Freq: Once | INTRAMUSCULAR | Status: AC | PRN
Start: 2023-06-15 — End: 2023-06-15
  Administered 2023-06-15: 1 mg via INTRAVENOUS

## 2023-06-15 MED ORDER — LAMOTRIGINE 200 MG OR TABS
400.0000 mg | ORAL_TABLET | Freq: Every morning | ORAL | Status: DC
Start: 2023-06-15 — End: 2023-06-29
  Administered 2023-06-15 – 2023-06-29 (×15): 400 mg via ORAL
  Filled 2023-06-15 (×3): qty 2
  Filled 2023-06-15: qty 4
  Filled 2023-06-15 (×2): qty 2
  Filled 2023-06-15 (×3): qty 4
  Filled 2023-06-15 (×2): qty 2
  Filled 2023-06-15: qty 4
  Filled 2023-06-15 (×2): qty 2
  Filled 2023-06-15: qty 4
  Filled 2023-06-15: qty 2

## 2023-06-15 MED ORDER — HYDROMORPHONE HCL 1 MG/ML IJ SOLN
INTRAMUSCULAR | Status: AC
Start: 2023-06-15 — End: 2023-06-15
  Filled 2023-06-15: qty 1

## 2023-06-15 MED ORDER — HYDROMORPHONE HCL 1 MG/ML IJ SOLN
0.2000 mg | Freq: Once | INTRAMUSCULAR | Status: DC | PRN
Start: 2023-06-15 — End: 2023-06-15

## 2023-06-15 MED ORDER — LACTATED RINGERS BOLUS
500.0000 mL | Freq: Once | INTRAVENOUS | Status: AC
Start: 2023-06-15 — End: 2023-06-15
  Administered 2023-06-15: 500 mL via INTRAVENOUS

## 2023-06-15 MED ORDER — OXYCODONE HCL ER 10 MG OR T12A
10.0000 mg | EXTENDED_RELEASE_TABLET | Freq: Two times a day (BID) | ORAL | Status: DC
Start: 2023-06-15 — End: 2023-06-16
  Administered 2023-06-15 – 2023-06-16 (×3): 10 mg via ORAL
  Filled 2023-06-15 (×3): qty 1

## 2023-06-15 MED ORDER — HYDROMORPHONE HCL 1 MG/ML IJ SOLN
0.5000 mg | Freq: Once | INTRAMUSCULAR | Status: AC
Start: 2023-06-15 — End: 2023-06-15
  Administered 2023-06-15: 0.5 mg via INTRAVENOUS

## 2023-06-15 MED ORDER — DEXTROSE 50 % IV SOLN
50.0000 mL | INTRAVENOUS | Status: DC | PRN
Start: 2023-06-15 — End: 2023-06-17

## 2023-06-15 MED ORDER — DEXTROSE 50 % IV SOLN
25.0000 mL | INTRAVENOUS | Status: DC | PRN
Start: 2023-06-15 — End: 2023-06-17

## 2023-06-15 MED ORDER — GLUCAGON HCL (DIAGNOSTIC) 1 MG IJ SOLR
1.0000 mg | INTRAMUSCULAR | Status: DC | PRN
Start: 2023-06-15 — End: 2023-06-17
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
## 2023-06-15 NOTE — Progress Notes (Addendum)
Airway:7.5/24/3.3^    Vent: VC 12/600(8)/+5 measures at 71 inches.     Shift Note:  Pt received from ED and placed on VC 16/600(8)/+5 40%. RR lowered to 12 due to alkalemia on ABG. FiO2 weaned to 30%. Pt has strong cough with suction. Suctioning for small amount of white secretions.     SBT started 0555. Tolerating well.
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
## 2023-06-16 ENCOUNTER — Inpatient Hospital Stay (HOSPITAL_COMMUNITY): Payer: Medicare HMO | Admitting: Unknown Physician Specialty

## 2023-06-16 ENCOUNTER — Encounter (HOSPITAL_COMMUNITY): Payer: Self-pay | Admitting: Unknown Physician Specialty

## 2023-06-16 ENCOUNTER — Inpatient Hospital Stay (HOSPITAL_COMMUNITY): Payer: Medicare HMO

## 2023-06-16 ENCOUNTER — Other Ambulatory Visit: Payer: Medicare HMO

## 2023-06-16 DIAGNOSIS — T1490XA Injury, unspecified, initial encounter: Secondary | ICD-10-CM

## 2023-06-16 DIAGNOSIS — G8911 Acute pain due to trauma: Secondary | ICD-10-CM

## 2023-06-16 DIAGNOSIS — R69 Illness, unspecified: Secondary | ICD-10-CM

## 2023-06-16 DIAGNOSIS — R0689 Other abnormalities of breathing: Secondary | ICD-10-CM

## 2023-06-16 DIAGNOSIS — R918 Other nonspecific abnormal finding of lung field: Secondary | ICD-10-CM

## 2023-06-16 DIAGNOSIS — Z0289 Encounter for other administrative examinations: Secondary | ICD-10-CM

## 2023-06-16 DIAGNOSIS — S2243XA Multiple fractures of ribs, bilateral, initial encounter for closed fracture: Secondary | ICD-10-CM

## 2023-06-16 DIAGNOSIS — M25462 Effusion, left knee: Secondary | ICD-10-CM

## 2023-06-16 DIAGNOSIS — Z9981 Dependence on supplemental oxygen: Secondary | ICD-10-CM

## 2023-06-16 LAB — CBC (HEMOGRAM)
Hematocrit: 35 % — ABNORMAL LOW (ref 38.0–50.0)
Hemoglobin: 11.6 g/dL — ABNORMAL LOW (ref 13.0–18.0)
MCH: 29.8 pg (ref 27.3–33.6)
MCHC: 33 g/dL (ref 32.2–36.5)
MCV: 90 fL (ref 81–98)
Platelet Count: 222 10*3/uL (ref 150–400)
RBC: 3.89 10*6/uL — ABNORMAL LOW (ref 4.40–5.60)
RDW-CV: 11.9 % (ref 11.0–14.5)
WBC: 14.79 10*3/uL — ABNORMAL HIGH (ref 4.3–10.0)

## 2023-06-16 LAB — R/O MRSA

## 2023-06-16 LAB — BASIC METABOLIC PANEL
Anion Gap: 12 (ref 4–12)
Calcium: 8.2 mg/dL — ABNORMAL LOW (ref 8.9–10.2)
Carbon Dioxide, Total: 22 meq/L (ref 22–32)
Chloride: 100 meq/L (ref 98–108)
Creatinine: 1.12 mg/dL (ref 0.51–1.18)
Glucose: 220 mg/dL — ABNORMAL HIGH (ref 62–125)
Potassium: 4.7 meq/L (ref 3.6–5.2)
Sodium: 134 meq/L — ABNORMAL LOW (ref 135–145)
Urea Nitrogen: 20 mg/dL (ref 8–21)
eGFR by CKD-EPI 2021: >60 mL/min/{1.73_m2} (ref >59)

## 2023-06-16 LAB — PHOSPHATE: Phosphate: 3.6 mg/dL (ref 2.5–4.5)

## 2023-06-16 LAB — HEMOGLOBIN A1C, HPLC: Hemoglobin A1C: 8.4 % — ABNORMAL HIGH (ref 4.0–6.0)

## 2023-06-16 LAB — GLUCOSE POC, HMC
Glucose (POC): 232 mg/dL — ABNORMAL HIGH (ref 62–125)
Glucose (POC): 253 mg/dL — ABNORMAL HIGH (ref 62–125)
Glucose (POC): 277 mg/dL — ABNORMAL HIGH (ref 62–125)
Glucose (POC): 306 mg/dL — ABNORMAL HIGH (ref 62–125)
Glucose (POC): 325 mg/dL — ABNORMAL HIGH (ref 62–125)

## 2023-06-16 LAB — TYPE AND SCREEN
ABO/Rh: O POS
Antibody Screen: NEGATIVE
Expiration Date of Blood Product: 202407192359
Unit Type and Rh: O POS
Units Ordered: 1

## 2023-06-16 LAB — MAGNESIUM: Magnesium: 1.9 mg/dL (ref 1.8–2.4)

## 2023-06-16 MED ORDER — OXYCODONE HCL 5 MG OR TABS
15.0000 mg | ORAL_TABLET | ORAL | Status: DC | PRN
Start: 2023-06-16 — End: 2023-06-16

## 2023-06-16 MED ORDER — GABAPENTIN 300 MG OR CAPS
300.0000 mg | ORAL_CAPSULE | Freq: Three times a day (TID) | ORAL | Status: DC
Start: 2023-06-17 — End: 2023-06-20
  Administered 2023-06-17 – 2023-06-19 (×10): 300 mg via ORAL
  Filled 2023-06-16 (×10): qty 1

## 2023-06-16 MED ORDER — LIDOCAINE HCL (PF) 1 % IJ SOLN
INTRAMUSCULAR | Status: DC | PRN
Start: 2023-06-16 — End: 2023-06-21
  Administered 2023-06-16: 5 mL via SUBCUTANEOUS

## 2023-06-16 MED ORDER — OXYCODONE HCL ER 10 MG OR T12A
10.0000 mg | EXTENDED_RELEASE_TABLET | Freq: Two times a day (BID) | ORAL | Status: DC
Start: 2023-06-16 — End: 2023-06-23
  Administered 2023-06-16 – 2023-06-23 (×14): 10 mg via ORAL
  Filled 2023-06-16 (×14): qty 1

## 2023-06-16 MED ORDER — LAMOTRIGINE 200 MG OR TABS
400.0000 mg | ORAL_TABLET | Freq: Once | ORAL | Status: AC
Start: 2023-06-16 — End: 2023-06-16
  Administered 2023-06-16: 400 mg via ORAL
  Filled 2023-06-16: qty 2

## 2023-06-16 MED ORDER — OXYCODONE HCL 5 MG OR TABS
5.0000 mg | ORAL_TABLET | ORAL | Status: DC | PRN
Start: 2023-06-16 — End: 2023-06-16
  Administered 2023-06-16 (×2): 10 mg via ORAL
  Filled 2023-06-16 (×2): qty 2

## 2023-06-16 MED ORDER — CALCIUM CARBONATE ANTACID 500 MG OR CHEW
1000.0000 mg | CHEWABLE_TABLET | Freq: Three times a day (TID) | ORAL | Status: DC | PRN
Start: 2023-06-16 — End: 2023-06-29

## 2023-06-16 MED ORDER — ONDANSETRON HCL 4 MG/2ML IJ SOLN
8.0000 mg | Freq: Three times a day (TID) | INTRAMUSCULAR | Status: DC | PRN
Start: 2023-06-16 — End: 2023-06-29
  Administered 2023-06-17 – 2023-06-20 (×4): 8 mg via INTRAVENOUS
  Filled 2023-06-16 (×4): qty 4

## 2023-06-16 MED ORDER — BUPIVACAINE HCL (PF) 0.25 % IJ SOLN
INTRAMUSCULAR | Status: DC | PRN
Start: 2023-06-16 — End: 2023-06-21
  Administered 2023-06-16: 30 mL via PERINEURAL

## 2023-06-16 MED ORDER — OXYCODONE HCL 5 MG OR TABS
5.0000 mg | ORAL_TABLET | ORAL | Status: DC | PRN
Start: 2023-06-16 — End: 2023-06-17
  Administered 2023-06-16 – 2023-06-17 (×5): 15 mg via ORAL
  Filled 2023-06-16 (×5): qty 3

## 2023-06-16 MED ORDER — MORPHINE SULFATE (PF) 2 MG/ML IV/IJ SOLN WRAPPER
1.0000 mg | Status: DC | PRN
Start: 2023-06-16 — End: 2023-06-22
  Administered 2023-06-16 – 2023-06-17 (×2): 2.5 mg via INTRAVENOUS
  Administered 2023-06-17 (×4): 2 mg via INTRAVENOUS
  Administered 2023-06-17: 2.5 mg via INTRAVENOUS
  Administered 2023-06-18 – 2023-06-22 (×25): 2 mg via INTRAVENOUS
  Filled 2023-06-16 (×9): qty 1
  Filled 2023-06-16: qty 2
  Filled 2023-06-16 (×3): qty 1
  Filled 2023-06-16: qty 2
  Filled 2023-06-16 (×9): qty 1
  Filled 2023-06-16 (×2): qty 2
  Filled 2023-06-16 (×6): qty 1
  Filled 2023-06-16: qty 2
  Filled 2023-06-16: qty 1

## 2023-06-16 MED ORDER — OXYCODONE HCL ER 10 MG OR T12A
10.0000 mg | EXTENDED_RELEASE_TABLET | Freq: Once | ORAL | Status: AC
Start: 2023-06-16 — End: 2023-06-16
  Administered 2023-06-16: 10 mg via ORAL
  Filled 2023-06-16: qty 1

## 2023-06-16 MED ORDER — ATORVASTATIN CALCIUM 20 MG OR TABS
20.0000 mg | ORAL_TABLET | Freq: Every day | ORAL | Status: DC
Start: 2023-06-16 — End: 2023-06-29
  Administered 2023-06-16 – 2023-06-29 (×14): 20 mg via ORAL
  Filled 2023-06-16 (×14): qty 1

## 2023-06-16 MED ORDER — METOCLOPRAMIDE HCL 5 MG/ML IJ SOLN
10.0000 mg | Freq: Three times a day (TID) | INTRAMUSCULAR | Status: DC
Start: 2023-06-16 — End: 2023-06-19
  Administered 2023-06-16 – 2023-06-18 (×8): 10 mg via INTRAVENOUS
  Filled 2023-06-16 (×9): qty 2

## 2023-06-16 NOTE — Progress Notes (Signed)
EXTUBATED 06/15/23    Rib Fracture Protocol: GOAL  ALERT actual 1400 - 2200     RA    Shift Note:     Patient weaned to room air from 2 L NC. Patient able to achieve 1400-2200 ml on IS. Breathsounds clear and diminished. Patient has a strong cough with splinting with some pain.

## 2023-06-16 NOTE — Anesthesia Procedure Notes (Signed)
Nerve Block    Staff:   Performing provider: Pollyann Kennedy, MD  Authorizing provider: Max Sane, MD    Block Indication/Location:   Patient location: Pre-op/PACU  Reason for block: non-surgical pain management.  Requesting provider: Baldemar Friday, MD.    Consent:   Type: written consent obtained and verbal consent obtained  Risk discussed: block failure or incomplete block, motor weakness, nerve injury, bleeding, infection and local anesthetic toxicity.  Obtained by: Pollyann Kennedy, MD.  Obtained from: patient    Regional anesthesia checklist:  Patient identifed with two distinct identifiers  Procedural consent reviewed  Block site confirmed by hospital protocol  Allergies reviewed  Anticoagulation status reviewed  Maximum local anesthetic dose calculated  Emergency drugs available  Medication syringes labeled  Monitoring in place    Block type:   Procedure type: single shot  Block laterality: bilateral  Block: other  Other: parasternal    Preparation:   Patient position: supine  Patient sedation: alert  Monitors: SpO2, ECG and NIBP  Skin prep: chlorhexidine  Sterile barriers: sterile gloves, mask, sterile drape and probe cover  Skin local anesthetic used: injected    Block needle:   Needle type: block needle  Needle gauge: 21 G  Needle length: 100 mm  Guidance: ultrasound (in plane)  Paresthesia: no  Ultrasound image saved: yes    Date and time block placed:   06/16/2023 12:45 PM    Assessment:   Motor block: absent  Pain relief: patient satisfied with pain relief from block  Complications: well tolerated

## 2023-06-16 NOTE — Progress Notes (Signed)
Airway:7.5/24/3.3^    EXTUBATED 06/15/23    Rib Fracture Protocol: GOAL  ALERT        Shift Note: Patient able to achieve 1250-1723ml on IS. Patient sleeping later in the shift. Breathsounds clear and diminished. Patient has a strong cough with splinting. Patient weaned to room air.
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
## 2023-06-17 ENCOUNTER — Inpatient Hospital Stay (HOSPITAL_COMMUNITY): Payer: Medicare HMO

## 2023-06-17 DIAGNOSIS — J9811 Atelectasis: Secondary | ICD-10-CM

## 2023-06-17 LAB — EKG 12 LEAD
Atrial Rate: 131 {beats}/min
P Axis: 45 degrees
P-R Interval: 140 ms
Q-T Interval: 298 ms
QRS Duration: 102 ms
QTC Calculation: 440 ms
R Axis: -62 degrees
T Axis: 34 degrees
Ventricular Rate: 131 {beats}/min

## 2023-06-17 LAB — GLUCOSE POC, HMC
Glucose (POC): 224 mg/dL — ABNORMAL HIGH (ref 62–125)
Glucose (POC): 248 mg/dL — ABNORMAL HIGH (ref 62–125)
Glucose (POC): 323 mg/dL — ABNORMAL HIGH (ref 62–125)
Glucose (POC): 332 mg/dL — ABNORMAL HIGH (ref 62–125)

## 2023-06-17 LAB — BASIC METABOLIC PANEL
Anion Gap: 8 (ref 4–12)
Calcium: 8.6 mg/dL — ABNORMAL LOW (ref 8.9–10.2)
Carbon Dioxide, Total: 27 meq/L (ref 22–32)
Chloride: 98 meq/L (ref 98–108)
Creatinine: 1.09 mg/dL (ref 0.51–1.18)
Glucose: 233 mg/dL — ABNORMAL HIGH (ref 62–125)
Potassium: 4.1 meq/L (ref 3.6–5.2)
Sodium: 133 meq/L — ABNORMAL LOW (ref 135–145)
Urea Nitrogen: 23 mg/dL — ABNORMAL HIGH (ref 8–21)
eGFR by CKD-EPI 2021: >60 mL/min/{1.73_m2} (ref >59)

## 2023-06-17 LAB — CBC (HEMOGRAM)
Hematocrit: 28 % — ABNORMAL LOW (ref 38.0–50.0)
Hemoglobin: 9.9 g/dL — ABNORMAL LOW (ref 13.0–18.0)
MCH: 30.2 pg (ref 27.3–33.6)
MCHC: 35 g/dL (ref 32.2–36.5)
MCV: 86 fL (ref 81–98)
Platelet Count: 218 10*3/uL (ref 150–400)
RBC: 3.28 10*6/uL — ABNORMAL LOW (ref 4.40–5.60)
RDW-CV: 12.1 % (ref 11.0–14.5)
WBC: 15.09 10*3/uL — ABNORMAL HIGH (ref 4.3–10.0)

## 2023-06-17 LAB — MAGNESIUM: Magnesium: 2.2 mg/dL (ref 1.8–2.4)

## 2023-06-17 LAB — PHOSPHATE: Phosphate: 2.4 mg/dL — ABNORMAL LOW (ref 2.5–4.5)

## 2023-06-17 MED ORDER — GLUCAGON HCL (DIAGNOSTIC) 1 MG IJ SOLR
0.5000 mg | INTRAMUSCULAR | Status: DC | PRN
Start: 2023-06-17 — End: 2023-06-18

## 2023-06-17 MED ORDER — DEXTROSE 50 % IV SOLN
25.0000 mL | INTRAVENOUS | Status: DC | PRN
Start: 2023-06-17 — End: 2023-06-18

## 2023-06-17 MED ORDER — GLUCAGON HCL (DIAGNOSTIC) 1 MG IJ SOLR
1.0000 mg | INTRAMUSCULAR | Status: DC | PRN
Start: 2023-06-17 — End: 2023-06-18

## 2023-06-17 MED ORDER — BISACODYL 10 MG RE SUPP
10.0000 mg | Freq: Every day | RECTAL | Status: DC | PRN
Start: 2023-06-17 — End: 2023-06-29
  Administered 2023-06-19: 10 mg via RECTAL
  Filled 2023-06-17: qty 1

## 2023-06-17 MED ORDER — LACTATED RINGERS BOLUS
500.0000 mL | Freq: Once | INTRAVENOUS | Status: AC
Start: 2023-06-17 — End: 2023-06-17
  Administered 2023-06-17: 500 mL via INTRAVENOUS

## 2023-06-17 MED ORDER — DEXTROSE 50 % IV SOLN
50.0000 mL | INTRAVENOUS | Status: DC | PRN
Start: 2023-06-17 — End: 2023-06-18

## 2023-06-17 MED ORDER — INSULIN GLARGINE 100 UNIT/ML SC SOLN VIAL WRAPPER
10.0000 [IU] | Freq: Once | SUBCUTANEOUS | Status: AC
Start: 2023-06-17 — End: 2023-06-17
  Administered 2023-06-17: 10 [IU] via SUBCUTANEOUS
  Filled 2023-06-17: qty 10

## 2023-06-17 MED ORDER — INSULIN GLARGINE 100 UNIT/ML SC SOLN VIAL WRAPPER
35.0000 [IU] | Freq: Every morning | SUBCUTANEOUS | Status: DC
Start: 2023-06-18 — End: 2023-06-18
  Filled 2023-06-17: qty 35

## 2023-06-17 MED ORDER — OXYCODONE HCL 5 MG OR TABS
15.0000 mg | ORAL_TABLET | ORAL | Status: DC | PRN
Start: 2023-06-17 — End: 2023-06-19
  Administered 2023-06-17: 15 mg via ORAL
  Administered 2023-06-17: 20 mg via ORAL
  Administered 2023-06-17: 5 mg via ORAL
  Administered 2023-06-17 – 2023-06-19 (×9): 20 mg via ORAL
  Filled 2023-06-17 (×2): qty 4
  Filled 2023-06-17: qty 1
  Filled 2023-06-17 (×5): qty 4
  Filled 2023-06-17: qty 3
  Filled 2023-06-17 (×3): qty 4
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
## 2023-06-17 NOTE — Progress Notes (Signed)
EXTUBATED 06/15/23    Rib Fracture Protocol: GOAL  ALERT actual 1400 - 2200     RA    Shift Note:   Patient on room air with adequate SPO2. Earlier in the shift patient able to achieve on IS. Later in the shift patient complained of pain. Breathsounds clear and diminished.  Patient has a good cough with splinting.
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
## 2023-06-17 NOTE — Progress Notes (Signed)
EXTUBATED 06/15/23    Rib Fracture Protocol: GOAL  ALERT actual 1400 - 2200     RA    Shift Note:   Patient on RA with  SPO2 in the high 90's.  Patient able to achieve on IS witn RN. Later in the shift patient complained of pain and unable to participate.

## 2023-06-18 ENCOUNTER — Inpatient Hospital Stay (HOSPITAL_COMMUNITY): Payer: Medicare HMO

## 2023-06-18 ENCOUNTER — Other Ambulatory Visit: Payer: Medicare HMO

## 2023-06-18 DIAGNOSIS — J90 Pleural effusion, not elsewhere classified: Secondary | ICD-10-CM

## 2023-06-18 DIAGNOSIS — R000 Tachycardia, unspecified: Secondary | ICD-10-CM

## 2023-06-18 DIAGNOSIS — R52 Pain, unspecified: Secondary | ICD-10-CM

## 2023-06-18 DIAGNOSIS — R079 Chest pain, unspecified: Secondary | ICD-10-CM

## 2023-06-18 DIAGNOSIS — J810 Acute pulmonary edema: Secondary | ICD-10-CM

## 2023-06-18 DIAGNOSIS — Z794 Long term (current) use of insulin: Secondary | ICD-10-CM

## 2023-06-18 DIAGNOSIS — R918 Other nonspecific abnormal finding of lung field: Secondary | ICD-10-CM

## 2023-06-18 DIAGNOSIS — J984 Other disorders of lung: Secondary | ICD-10-CM

## 2023-06-18 DIAGNOSIS — Z0289 Encounter for other administrative examinations: Secondary | ICD-10-CM

## 2023-06-18 LAB — CBC (HEMOGRAM)
Hematocrit: 26 % — ABNORMAL LOW (ref 38.0–50.0)
Hemoglobin: 8.7 g/dL — ABNORMAL LOW (ref 13.0–18.0)
MCH: 29.8 pg (ref 27.3–33.6)
MCHC: 33.9 g/dL (ref 32.2–36.5)
MCV: 88 fL (ref 81–98)
Platelet Count: 246 10*3/uL (ref 150–400)
RBC: 2.92 10*6/uL — ABNORMAL LOW (ref 4.40–5.60)
RDW-CV: 11.9 % (ref 11.0–14.5)
WBC: 13.56 10*3/uL — ABNORMAL HIGH (ref 4.3–10.0)

## 2023-06-18 LAB — BASIC METABOLIC PANEL
Anion Gap: 8 (ref 4–12)
Calcium: 8.3 mg/dL — ABNORMAL LOW (ref 8.9–10.2)
Carbon Dioxide, Total: 28 meq/L (ref 22–32)
Chloride: 95 meq/L — ABNORMAL LOW (ref 98–108)
Creatinine: 0.93 mg/dL (ref 0.51–1.18)
Glucose: 259 mg/dL — ABNORMAL HIGH (ref 62–125)
Potassium: 4.2 meq/L (ref 3.6–5.2)
Sodium: 131 meq/L — ABNORMAL LOW (ref 135–145)
Urea Nitrogen: 21 mg/dL (ref 8–21)
eGFR by CKD-EPI 2021: >60 mL/min/{1.73_m2} (ref >59)

## 2023-06-18 LAB — EKG 12 LEAD
Atrial Rate: 141 {beats}/min
P Axis: 23 degrees
P-R Interval: 116 ms
Q-T Interval: 346 ms
QRS Duration: 124 ms
QTC Calculation: 529 ms
R Axis: 113 degrees
T Axis: -7 degrees
Ventricular Rate: 141 {beats}/min

## 2023-06-18 LAB — GLUCOSE POC, HMC
Glucose (POC): 253 mg/dL — ABNORMAL HIGH (ref 62–125)
Glucose (POC): 348 mg/dL — ABNORMAL HIGH (ref 62–125)
Glucose (POC): 354 mg/dL — ABNORMAL HIGH (ref 62–125)
Glucose (POC): 360 mg/dL — ABNORMAL HIGH (ref 62–125)
Glucose (POC): 323 mg/dL — ABNORMAL HIGH (ref 62–125)
Glucose (POC): 316 mg/dL — ABNORMAL HIGH (ref 62–125)
Glucose (POC): 267 mg/dL — ABNORMAL HIGH (ref 62–125)
Glucose (POC): 245 mg/dL — ABNORMAL HIGH (ref 62–125)

## 2023-06-18 LAB — PHOSPHATE: Phosphate: 2 mg/dL — ABNORMAL LOW (ref 2.5–4.5)

## 2023-06-18 LAB — MAGNESIUM: Magnesium: 2 mg/dL (ref 1.8–2.4)

## 2023-06-18 MED ORDER — MULTIVITAMIN-MINERALS OR TAB
1.0000 | ORAL_TABLET | Freq: Every day | ORAL | Status: DC
Start: 2023-06-18 — End: 2023-06-29
  Administered 2023-06-18 – 2023-06-29 (×12): 1 via ORAL
  Filled 2023-06-18 (×12): qty 1

## 2023-06-18 MED ORDER — DEXTROSE-SODIUM CHLORIDE 5-0.45 % IV SOLN
100.0000 mL/h | INTRAVENOUS | Status: DC | PRN
Start: 2023-06-18 — End: 2023-06-19
  Filled 2023-06-18: qty 1000

## 2023-06-18 MED ORDER — FUROSEMIDE 10 MG/ML IJ SOLN
20.0000 mg | Freq: Four times a day (QID) | INTRAMUSCULAR | Status: AC
Start: 2023-06-18 — End: 2023-06-18
  Administered 2023-06-18 (×2): 20 mg via INTRAVENOUS
  Filled 2023-06-18: qty 2
  Filled 2023-06-18: qty 4

## 2023-06-18 MED ORDER — DEXTROSE 50 % IV SOLN
25.0000 mL | INTRAVENOUS | Status: DC | PRN
Start: 2023-06-18 — End: 2023-06-20

## 2023-06-18 MED ORDER — DEXTROSE 50 % IV SOLN
50.0000 mL | INTRAVENOUS | Status: DC | PRN
Start: 2023-06-18 — End: 2023-06-20

## 2023-06-18 MED ORDER — GLUCAGON HCL (DIAGNOSTIC) 1 MG IJ SOLR
0.5000 mg | INTRAMUSCULAR | Status: DC | PRN
Start: 2023-06-18 — End: 2023-06-20

## 2023-06-18 MED ORDER — INSULIN GLARGINE 100 UNIT/ML SC SOLN VIAL WRAPPER
45.0000 [IU] | Freq: Every morning | SUBCUTANEOUS | Status: DC
Start: 2023-06-18 — End: 2023-06-18
  Administered 2023-06-18: 45 [IU] via SUBCUTANEOUS
  Filled 2023-06-18: qty 45

## 2023-06-18 MED ORDER — INSULIN REGULAR(HUMAN) IN NACL 100-0.9 UT/100ML-% IV SOLN
0.0000 [IU]/h | INTRAVENOUS | Status: DC
Start: 2023-06-18 — End: 2023-06-20
  Administered 2023-06-18: 4 [IU]/h via INTRAVENOUS
  Administered 2023-06-19: 7.5 [IU]/h via INTRAVENOUS
  Filled 2023-06-18 (×3): qty 100

## 2023-06-18 MED ORDER — IBUPROFEN 600 MG OR TABS
600.0000 mg | ORAL_TABLET | Freq: Three times a day (TID) | ORAL | Status: DC
Start: 2023-06-18 — End: 2023-06-21
  Administered 2023-06-18 – 2023-06-21 (×10): 600 mg via ORAL
  Filled 2023-06-18 (×10): qty 1

## 2023-06-18 MED ORDER — ASCORBIC ACID 500 MG OR TABS
500.0000 mg | ORAL_TABLET | Freq: Every day | ORAL | Status: DC
Start: 2023-06-18 — End: 2023-06-29
  Administered 2023-06-18 – 2023-06-29 (×12): 500 mg via ORAL
  Filled 2023-06-18 (×12): qty 1

## 2023-06-18 MED ORDER — IOHEXOL 350 MG/ML IV SOLN
100.0000 mL | Freq: Once | INTRAVENOUS | Status: AC | PRN
Start: 2023-06-18 — End: 2023-06-18
  Administered 2023-06-18: 100 mL via INTRAVENOUS

## 2023-06-18 MED ORDER — INSULIN LISPRO 100 UNIT/ML IJ SOLN
10.0000 [IU] | Freq: Three times a day (TID) | INTRAMUSCULAR | Status: DC
Start: 2023-06-18 — End: 2023-06-18
  Administered 2023-06-18: 10 [IU] via SUBCUTANEOUS
  Filled 2023-06-18: qty 10

## 2023-06-18 MED ORDER — GLUCAGON HCL (DIAGNOSTIC) 1 MG IJ SOLR
1.0000 mg | INTRAMUSCULAR | Status: DC | PRN
Start: 2023-06-18 — End: 2023-06-20
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
## 2023-06-18 NOTE — Progress Notes (Signed)
Physical Therapy  PT orders received, chart reviewed. Attempted to see patient this date, however getting transferred to different floor upon approach. Patient mobilized up to chair with nursing this AM. Acute PT will follow up for initial assessment.    Adrian Prince, PT
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
## 2023-06-18 NOTE — Nursing Note (Signed)
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 Given as avaliable    Illness Severity  Stable

## 2023-06-18 NOTE — Progress Notes (Signed)
EXTUBATED 06/15/23    Rib Fracture Protocol: GOAL  ALERT actual 1400 - 2200     RA    Shift Note:   Pt remains on RA sating mid to high 90's. Pt achieved during IS in the morning and declined after 2nd rounds. RT will continue to follow Q6
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
## 2023-06-18 NOTE — Progress Notes (Signed)
EXTUBATED 06/15/23    Rib Fracture Protocol: GOAL  ALERT actual 1400 - 2200     RA    Shift Note:   Per RN patient able to achieve on IS. RT went to see patient but patient in pain and didn't want to do IS. Breathsounds clear and diminished.

## 2023-06-19 LAB — BASIC METABOLIC PANEL
Anion Gap: 9 (ref 4–12)
Calcium: 8.3 mg/dL — ABNORMAL LOW (ref 8.9–10.2)
Carbon Dioxide, Total: 29 meq/L (ref 22–32)
Chloride: 95 meq/L — ABNORMAL LOW (ref 98–108)
Creatinine: 0.99 mg/dL (ref 0.51–1.18)
Glucose: 212 mg/dL — ABNORMAL HIGH (ref 62–125)
Potassium: 3.8 meq/L (ref 3.6–5.2)
Sodium: 133 meq/L — ABNORMAL LOW (ref 135–145)
Urea Nitrogen: 23 mg/dL — ABNORMAL HIGH (ref 8–21)
eGFR by CKD-EPI 2021: >60 mL/min/{1.73_m2} (ref >59)

## 2023-06-19 LAB — CBC (HEMOGRAM)
Hematocrit: 24 % — ABNORMAL LOW (ref 38.0–50.0)
Hemoglobin: 8.3 g/dL — ABNORMAL LOW (ref 13.0–18.0)
MCH: 30.2 pg (ref 27.3–33.6)
MCHC: 34.4 g/dL (ref 32.2–36.5)
MCV: 88 fL (ref 81–98)
Platelet Count: 244 10*3/uL (ref 150–400)
RBC: 2.75 10*6/uL — ABNORMAL LOW (ref 4.40–5.60)
RDW-CV: 12.1 % (ref 11.0–14.5)
WBC: 11.36 10*3/uL — ABNORMAL HIGH (ref 4.3–10.0)

## 2023-06-19 LAB — GLUCOSE POC, HMC
Glucose (POC): 118 mg/dL (ref 62–125)
Glucose (POC): 119 mg/dL (ref 62–125)
Glucose (POC): 122 mg/dL (ref 62–125)
Glucose (POC): 126 mg/dL — ABNORMAL HIGH (ref 62–125)
Glucose (POC): 134 mg/dL — ABNORMAL HIGH (ref 62–125)
Glucose (POC): 141 mg/dL — ABNORMAL HIGH (ref 62–125)
Glucose (POC): 145 mg/dL — ABNORMAL HIGH (ref 62–125)
Glucose (POC): 153 mg/dL — ABNORMAL HIGH (ref 62–125)
Glucose (POC): 156 mg/dL — ABNORMAL HIGH (ref 62–125)
Glucose (POC): 158 mg/dL — ABNORMAL HIGH (ref 62–125)
Glucose (POC): 158 mg/dL — ABNORMAL HIGH (ref 62–125)
Glucose (POC): 165 mg/dL — ABNORMAL HIGH (ref 62–125)
Glucose (POC): 183 mg/dL — ABNORMAL HIGH (ref 62–125)
Glucose (POC): 198 mg/dL — ABNORMAL HIGH (ref 62–125)
Glucose (POC): 221 mg/dL — ABNORMAL HIGH (ref 62–125)
Glucose (POC): 233 mg/dL — ABNORMAL HIGH (ref 62–125)
Glucose (POC): 286 mg/dL — ABNORMAL HIGH (ref 62–125)
Glucose (POC): 299 mg/dL — ABNORMAL HIGH (ref 62–125)
Glucose (POC): 305 mg/dL — ABNORMAL HIGH (ref 62–125)
Glucose (POC): 311 mg/dL — ABNORMAL HIGH (ref 62–125)

## 2023-06-19 LAB — MAGNESIUM: Magnesium: 2.1 mg/dL (ref 1.8–2.4)

## 2023-06-19 LAB — PHOSPHATE: Phosphate: 2.7 mg/dL (ref 2.5–4.5)

## 2023-06-19 MED ORDER — FUROSEMIDE 10 MG/ML IJ SOLN
20.0000 mg | Freq: Once | INTRAMUSCULAR | Status: AC
Start: 2023-06-19 — End: 2023-06-19
  Administered 2023-06-19: 20 mg via INTRAVENOUS
  Filled 2023-06-19: qty 4

## 2023-06-19 MED ORDER — METOCLOPRAMIDE HCL 5 MG/ML IJ SOLN
10.0000 mg | Freq: Three times a day (TID) | INTRAMUSCULAR | Status: DC | PRN
Start: 2023-06-19 — End: 2023-06-29

## 2023-06-19 MED ORDER — OXYCODONE HCL 5 MG OR TABS
5.0000 mg | ORAL_TABLET | ORAL | Status: DC | PRN
Start: 2023-06-19 — End: 2023-06-20
  Administered 2023-06-19: 20 mg via ORAL
  Administered 2023-06-19 (×2): 10 mg via ORAL
  Filled 2023-06-19 (×2): qty 2
  Filled 2023-06-19: qty 4
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
## 2023-06-19 NOTE — Nursing Note (Addendum)
Patient Summary    Arrived from ICU borders around 1800. Aox4, pleasant and cooperative. PERRL, MAE weakly d/t pain, following commands. VSS on RA, HR slighty tachycardic but has been throughout hospitalization. Remains on ketamine gtt with some pain control, needing additional PRNs-- see MAR. Good PO intake. Remains on insulin gtt. Bruising on L thigh and R arm noted, borders are marked. Taught, but neurovascular assessment unremarkable otherwise. Sensation and movement intact. Daughters visiting at the bedside.
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
## 2023-06-20 LAB — GLUCOSE POC, HMC
Glucose (POC): 124 mg/dL (ref 62–125)
Glucose (POC): 129 mg/dL — ABNORMAL HIGH (ref 62–125)
Glucose (POC): 135 mg/dL — ABNORMAL HIGH (ref 62–125)
Glucose (POC): 143 mg/dL — ABNORMAL HIGH (ref 62–125)
Glucose (POC): 178 mg/dL — ABNORMAL HIGH (ref 62–125)
Glucose (POC): 181 mg/dL — ABNORMAL HIGH (ref 62–125)
Glucose (POC): 185 mg/dL — ABNORMAL HIGH (ref 62–125)
Glucose (POC): 199 mg/dL — ABNORMAL HIGH (ref 62–125)
Glucose (POC): 201 mg/dL — ABNORMAL HIGH (ref 62–125)
Glucose (POC): 203 mg/dL — ABNORMAL HIGH (ref 62–125)
Glucose (POC): 256 mg/dL — ABNORMAL HIGH (ref 62–125)
Glucose (POC): 261 mg/dL — ABNORMAL HIGH (ref 62–125)
Glucose (POC): 270 mg/dL — ABNORMAL HIGH (ref 62–125)
Glucose (POC): 272 mg/dL — ABNORMAL HIGH (ref 62–125)
Glucose (POC): 278 mg/dL — ABNORMAL HIGH (ref 62–125)
Glucose (POC): 289 mg/dL — ABNORMAL HIGH (ref 62–125)
Glucose (POC): 292 mg/dL — ABNORMAL HIGH (ref 62–125)
Glucose (POC): 298 mg/dL — ABNORMAL HIGH (ref 62–125)
Glucose (POC): 298 mg/dL — ABNORMAL HIGH (ref 62–125)
Glucose (POC): 309 mg/dL — ABNORMAL HIGH (ref 62–125)
Glucose (POC): 316 mg/dL — ABNORMAL HIGH (ref 62–125)
Glucose (POC): 360 mg/dL — ABNORMAL HIGH (ref 62–125)

## 2023-06-20 LAB — BASIC METABOLIC PANEL
Anion Gap: 7 (ref 4–12)
Calcium: 8.2 mg/dL — ABNORMAL LOW (ref 8.9–10.2)
Carbon Dioxide, Total: 30 meq/L (ref 22–32)
Chloride: 99 meq/L (ref 98–108)
Creatinine: 0.77 mg/dL (ref 0.51–1.18)
Glucose: 139 mg/dL — ABNORMAL HIGH (ref 62–125)
Potassium: 3.2 meq/L — ABNORMAL LOW (ref 3.6–5.2)
Sodium: 136 meq/L (ref 135–145)
Urea Nitrogen: 19 mg/dL (ref 8–21)
eGFR by CKD-EPI 2021: >60 mL/min/{1.73_m2} (ref >59)

## 2023-06-20 LAB — PHOSPHATE: Phosphate: 1.5 mg/dL — ABNORMAL LOW (ref 2.5–4.5)

## 2023-06-20 LAB — 1ST EXTRA LAVENDER TOP

## 2023-06-20 LAB — MAGNESIUM: Magnesium: 2.1 mg/dL (ref 1.8–2.4)

## 2023-06-20 MED ORDER — DEXTROSE-SODIUM CHLORIDE 5-0.45 % IV SOLN
100.0000 mL/h | INTRAVENOUS | Status: DC | PRN
Start: 2023-06-20 — End: 2023-06-23

## 2023-06-20 MED ORDER — INSULIN REGULAR HUMAN 100 UNIT/ML IJ SOLN
0.0000 [IU]/h | INTRAVENOUS | Status: DC
Start: 2023-06-20 — End: 2023-06-23
  Administered 2023-06-20: 17 [IU]/h via INTRAVENOUS
  Filled 2023-06-20 (×3): qty 2.5

## 2023-06-20 MED ORDER — METHOCARBAMOL 500 MG OR TABS
500.0000 mg | ORAL_TABLET | Freq: Once | ORAL | Status: AC
Start: 2023-06-20 — End: 2023-06-20
  Administered 2023-06-20: 500 mg via ORAL
  Filled 2023-06-20: qty 1

## 2023-06-20 MED ORDER — KETAMINE 250 MG IN NS 250 ML INFUSION (PKG PREMIX)
8.0000 mg/h | Status: DC
Start: 2023-06-20 — End: 2023-06-21
  Filled 2023-06-20 (×2): qty 250

## 2023-06-20 MED ORDER — KETOROLAC TROMETHAMINE 15 MG/ML IJ SOLN
15.0000 mg | Freq: Four times a day (QID) | INTRAMUSCULAR | Status: DC
Start: 2023-06-20 — End: 2023-06-24
  Administered 2023-06-20 – 2023-06-24 (×17): 15 mg via INTRAVENOUS
  Filled 2023-06-20 (×17): qty 1

## 2023-06-20 MED ORDER — POTASSIUM CHLORIDE CRYS ER 20 MEQ OR TBCR
40.0000 meq | EXTENDED_RELEASE_TABLET | Freq: Two times a day (BID) | ORAL | Status: DC
Start: 2023-06-20 — End: 2023-06-29
  Administered 2023-06-20 – 2023-06-29 (×19): 40 meq via ORAL
  Filled 2023-06-20 (×19): qty 2

## 2023-06-20 MED ORDER — GLUCAGON HCL (DIAGNOSTIC) 1 MG IJ SOLR
1.0000 mg | INTRAMUSCULAR | Status: DC | PRN
Start: 2023-06-20 — End: 2023-06-23

## 2023-06-20 MED ORDER — GLUCAGON HCL (DIAGNOSTIC) 1 MG IJ SOLR
0.5000 mg | INTRAMUSCULAR | Status: DC | PRN
Start: 2023-06-20 — End: 2023-06-23

## 2023-06-20 MED ORDER — GABAPENTIN 600 MG OR TABS
600.0000 mg | ORAL_TABLET | Freq: Three times a day (TID) | ORAL | Status: DC
Start: 2023-06-20 — End: 2023-06-28
  Administered 2023-06-20 – 2023-06-28 (×26): 600 mg via ORAL
  Filled 2023-06-20 (×26): qty 1

## 2023-06-20 MED ORDER — DEXTROSE 50 % IV SOLN
25.0000 mL | INTRAVENOUS | Status: DC | PRN
Start: 2023-06-20 — End: 2023-06-23

## 2023-06-20 MED ORDER — PHOSPHA 250 NEUTRAL 155-852-130 MG OR TABS
1.0000 | ORAL_TABLET | Freq: Two times a day (BID) | ORAL | Status: DC
Start: 2023-06-20 — End: 2023-06-29
  Administered 2023-06-20 – 2023-06-29 (×19): 1 via ORAL
  Filled 2023-06-20 (×21): qty 1

## 2023-06-20 MED ORDER — HYDROMORPHONE HCL 1 MG/ML IJ SOLN
0.5000 mg | Freq: Once | INTRAMUSCULAR | Status: AC | PRN
Start: 2023-06-20 — End: 2023-06-20
  Administered 2023-06-20: 1 mg via INTRAVENOUS
  Filled 2023-06-20: qty 1

## 2023-06-20 MED ORDER — METHOCARBAMOL 500 MG OR TABS
1000.0000 mg | ORAL_TABLET | Freq: Four times a day (QID) | ORAL | Status: DC | PRN
Start: 2023-06-20 — End: 2023-06-29
  Administered 2023-06-20 – 2023-06-29 (×25): 1000 mg via ORAL
  Filled 2023-06-20 (×26): qty 2

## 2023-06-20 MED ORDER — OXYCODONE HCL 5 MG OR TABS
5.0000 mg | ORAL_TABLET | ORAL | Status: DC | PRN
Start: 2023-06-20 — End: 2023-06-22
  Administered 2023-06-20 – 2023-06-21 (×8): 20 mg via ORAL
  Administered 2023-06-21: 10 mg via ORAL
  Administered 2023-06-21 – 2023-06-22 (×9): 20 mg via ORAL
  Filled 2023-06-20 (×18): qty 4

## 2023-06-20 MED ORDER — POTASSIUM PHOSPHATE 40 MEQ IN NS 259 ML IVPB (PKG PREMIX)
40.0000 meq | INJECTION | Freq: Once | INTRAMUSCULAR | Status: DC
Start: 2023-06-20 — End: 2023-06-20
  Filled 2023-06-20: qty 259

## 2023-06-20 MED ORDER — DEXTROSE 50 % IV SOLN
50.0000 mL | INTRAVENOUS | Status: DC | PRN
Start: 2023-06-20 — End: 2023-06-23

## 2023-06-20 NOTE — Progress Notes (Signed)
EXTUBATED 06/15/23    Rib Fracture Protocol: GOAL  ALERT     RA    Shift Note:   IS done to this evening. Reports pain is well managed at this time. Stable on RA
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
## 2023-06-21 LAB — BASIC METABOLIC PANEL
Anion Gap: 6 (ref 4–12)
Calcium: 8.3 mg/dL — ABNORMAL LOW (ref 8.9–10.2)
Carbon Dioxide, Total: 30 meq/L (ref 22–32)
Chloride: 98 meq/L (ref 98–108)
Creatinine: 0.78 mg/dL (ref 0.51–1.18)
Glucose: 146 mg/dL — ABNORMAL HIGH (ref 62–125)
Potassium: 3.8 meq/L (ref 3.6–5.2)
Sodium: 134 meq/L — ABNORMAL LOW (ref 135–145)
Urea Nitrogen: 15 mg/dL (ref 8–21)
eGFR by CKD-EPI 2021: >60 mL/min/{1.73_m2} (ref >59)

## 2023-06-21 LAB — GLUCOSE POC, HMC
Glucose (POC): 106 mg/dL (ref 62–125)
Glucose (POC): 137 mg/dL — ABNORMAL HIGH (ref 62–125)
Glucose (POC): 147 mg/dL — ABNORMAL HIGH (ref 62–125)
Glucose (POC): 156 mg/dL — ABNORMAL HIGH (ref 62–125)
Glucose (POC): 157 mg/dL — ABNORMAL HIGH (ref 62–125)
Glucose (POC): 159 mg/dL — ABNORMAL HIGH (ref 62–125)
Glucose (POC): 164 mg/dL — ABNORMAL HIGH (ref 62–125)
Glucose (POC): 169 mg/dL — ABNORMAL HIGH (ref 62–125)
Glucose (POC): 177 mg/dL — ABNORMAL HIGH (ref 62–125)
Glucose (POC): 177 mg/dL — ABNORMAL HIGH (ref 62–125)
Glucose (POC): 193 mg/dL — ABNORMAL HIGH (ref 62–125)
Glucose (POC): 253 mg/dL — ABNORMAL HIGH (ref 62–125)

## 2023-06-21 LAB — MAGNESIUM: Magnesium: 1.8 mg/dL (ref 1.8–2.4)

## 2023-06-21 LAB — PHOSPHATE: Phosphate: 3.4 mg/dL (ref 2.5–4.5)

## 2023-06-21 MED ORDER — MUSCLE RUB 10-15 % EX CREA
TOPICAL_CREAM | Freq: Two times a day (BID) | CUTANEOUS | Status: DC | PRN
Start: 2023-06-21 — End: 2023-06-29
  Filled 2023-06-21: qty 85

## 2023-06-21 MED ORDER — INSULIN GLARGINE 100 UNIT/ML SC SOLN VIAL WRAPPER
25.0000 [IU] | Freq: Once | SUBCUTANEOUS | Status: AC
Start: 2023-06-21 — End: 2023-06-21
  Administered 2023-06-21: 25 [IU] via SUBCUTANEOUS
  Filled 2023-06-21: qty 25

## 2023-06-21 MED ORDER — INSULIN LISPRO 100 UNIT/ML IJ SOLN
0.0000 [IU] | Freq: Three times a day (TID) | INTRAMUSCULAR | Status: DC
Start: 2023-06-21 — End: 2023-06-23
  Administered 2023-06-21 (×2): 2 [IU] via SUBCUTANEOUS
  Administered 2023-06-22: 5 [IU] via SUBCUTANEOUS
  Administered 2023-06-22 – 2023-06-23 (×3): 3 [IU] via SUBCUTANEOUS
  Filled 2023-06-21 (×3): qty 3
  Filled 2023-06-21: qty 2
  Filled 2023-06-21: qty 5
  Filled 2023-06-21: qty 2

## 2023-06-21 MED ORDER — DICLOFENAC SODIUM 1 % EX GEL
2.0000 g | Freq: Four times a day (QID) | CUTANEOUS | Status: DC | PRN
Start: 2023-06-21 — End: 2023-06-29
  Administered 2023-06-21 – 2023-06-29 (×11): 2 g via TOPICAL
  Filled 2023-06-21 (×3): qty 100

## 2023-06-21 MED ORDER — INSULIN GLARGINE 100 UNIT/ML SC SOLN VIAL WRAPPER
20.0000 [IU] | Freq: Once | SUBCUTANEOUS | Status: AC
Start: 2023-06-21 — End: 2023-06-21
  Administered 2023-06-21: 20 [IU] via SUBCUTANEOUS
  Filled 2023-06-21: qty 20
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
## 2023-06-22 LAB — BASIC METABOLIC PANEL
Anion Gap: 6 (ref 4–12)
Calcium: 8.2 mg/dL — ABNORMAL LOW (ref 8.9–10.2)
Carbon Dioxide, Total: 28 meq/L (ref 22–32)
Chloride: 100 meq/L (ref 98–108)
Creatinine: 0.81 mg/dL (ref 0.51–1.18)
Glucose: 239 mg/dL — ABNORMAL HIGH (ref 62–125)
Potassium: 4.9 meq/L (ref 3.6–5.2)
Sodium: 134 meq/L — ABNORMAL LOW (ref 135–145)
Urea Nitrogen: 15 mg/dL (ref 8–21)
eGFR by CKD-EPI 2021: >60 mL/min/{1.73_m2} (ref >59)

## 2023-06-22 LAB — GLUCOSE POC, HMC
Glucose (POC): 229 mg/dL — ABNORMAL HIGH (ref 62–125)
Glucose (POC): 245 mg/dL — ABNORMAL HIGH (ref 62–125)
Glucose (POC): 249 mg/dL — ABNORMAL HIGH (ref 62–125)
Glucose (POC): 264 mg/dL — ABNORMAL HIGH (ref 62–125)
Glucose (POC): 268 mg/dL — ABNORMAL HIGH (ref 62–125)

## 2023-06-22 LAB — PHOSPHATE: Phosphate: 2.9 mg/dL (ref 2.5–4.5)

## 2023-06-22 LAB — MAGNESIUM: Magnesium: 1.8 mg/dL (ref 1.8–2.4)

## 2023-06-22 MED ORDER — OXYCODONE HCL 5 MG OR TABS
15.0000 mg | ORAL_TABLET | ORAL | Status: DC | PRN
Start: 2023-06-22 — End: 2023-06-23
  Administered 2023-06-22 – 2023-06-23 (×8): 25 mg via ORAL
  Filled 2023-06-22 (×8): qty 5

## 2023-06-22 MED ORDER — INSULIN GLARGINE 100 UNIT/ML SC SOLN VIAL WRAPPER
20.0000 [IU] | Freq: Once | SUBCUTANEOUS | Status: AC
Start: 2023-06-22 — End: 2023-06-22
  Administered 2023-06-22: 20 [IU] via SUBCUTANEOUS
  Filled 2023-06-22: qty 20

## 2023-06-22 MED ORDER — INSULIN GLARGINE 100 UNIT/ML SC SOLN VIAL WRAPPER
25.0000 [IU] | Freq: Once | SUBCUTANEOUS | Status: AC
Start: 2023-06-22 — End: 2023-06-22
  Administered 2023-06-22: 25 [IU] via SUBCUTANEOUS
  Filled 2023-06-22: qty 25

## 2023-06-22 MED ORDER — MORPHINE SULFATE (PF) 2 MG/ML IV/IJ SOLN WRAPPER
1.0000 mg | Freq: Three times a day (TID) | Status: DC | PRN
Start: 2023-06-22 — End: 2023-06-24
  Administered 2023-06-22 – 2023-06-23 (×4): 2 mg via INTRAVENOUS
  Filled 2023-06-22 (×4): qty 1

## 2023-06-22 NOTE — Nursing Note (Signed)
06/22/23 0945   Mobility   Highest Level of Mobility for this Event (Nursing) 2   Level of Assistance Minimal assist, patient does 75% or more   Assistive Device Standard walker  (txfr chair follow behind)   Distance Ambulated (ft) 15 ft   Ambulation Response With pre-medication;Unable to tolerate   Number of Assistive Personnel 2       Mobility Event    Time in: 09:15 am  Time out:09:42 am      ACTIVITY    X   Comments:      Bed Exercises       Dangle Edge of Bed       Transfer to chair/commode        X  Returned to bed        X  Ambulation  Distance: 15      Wheelchair Mobility   Distance:       Other:      EQUIPMENT    X  Assistive Device X  Other Equipment       X  Designer, multimedia   Drop-arm Commode       X  Wheelchair follow behind        Other:     LEVEL OF ASSIST PROVIDED    X  X       Independent   Moderate Assist (patient does 50-74%)      X  Supervision/Stand by Assist    Maximum Assist (patient does 25-49%)       X  Contact Guard/Steadying Assist   Dependent (patient does less than 25%)      Minimal Assist (patient does 75% or more)       NUMBER OF PEOPLE ASSISTING:  2 (PT session)     PATIENT POSITION - at end of mobility event    X  X        X  Lying in bed   Bed/Chair alarm activated      Bed in chair position   Restraints reapplied      Sitting Edge of Bed   X  Call light in reach      Sitting up in chair/on commode        Other:     PATIENT TOLERANCE - during mobility event    X       Patient tolerated well      X  Other: unable to tolerate     Comments:
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
## 2023-06-23 DIAGNOSIS — E1165 Type 2 diabetes mellitus with hyperglycemia: Secondary | ICD-10-CM

## 2023-06-23 DIAGNOSIS — Z79899 Other long term (current) drug therapy: Secondary | ICD-10-CM

## 2023-06-23 DIAGNOSIS — H538 Other visual disturbances: Secondary | ICD-10-CM

## 2023-06-23 LAB — BASIC METABOLIC PANEL
Anion Gap: 6 (ref 4–12)
Calcium: 8.5 mg/dL — ABNORMAL LOW (ref 8.9–10.2)
Carbon Dioxide, Total: 29 meq/L (ref 22–32)
Chloride: 100 meq/L (ref 98–108)
Creatinine: 0.75 mg/dL (ref 0.51–1.18)
Glucose: 213 mg/dL — ABNORMAL HIGH (ref 62–125)
Potassium: 4.9 meq/L (ref 3.6–5.2)
Sodium: 135 meq/L (ref 135–145)
Urea Nitrogen: 14 mg/dL (ref 8–21)
eGFR by CKD-EPI 2021: >60 mL/min/{1.73_m2} (ref >59)

## 2023-06-23 LAB — GLUCOSE POC, HMC
Glucose (POC): 234 mg/dL — ABNORMAL HIGH (ref 62–125)
Glucose (POC): 211 mg/dL — ABNORMAL HIGH (ref 62–125)
Glucose (POC): 220 mg/dL — ABNORMAL HIGH (ref 62–125)
Glucose (POC): 194 mg/dL — ABNORMAL HIGH (ref 62–125)

## 2023-06-23 LAB — MAGNESIUM: Magnesium: 1.9 mg/dL (ref 1.8–2.4)

## 2023-06-23 LAB — PHOSPHATE: Phosphate: 3.3 mg/dL (ref 2.5–4.5)

## 2023-06-23 MED ORDER — GLUCAGON HCL (DIAGNOSTIC) 1 MG IJ SOLR
0.5000 mg | INTRAMUSCULAR | Status: DC | PRN
Start: 2023-06-23 — End: 2023-06-29

## 2023-06-23 MED ORDER — INSULIN LISPRO 100 UNIT/ML IJ SOLN
0.0000 [IU] | Freq: Every evening | INTRAMUSCULAR | Status: DC
Start: 2023-06-23 — End: 2023-06-29
  Filled 2023-06-23: qty 3

## 2023-06-23 MED ORDER — INSULIN GLARGINE 100 UNIT/ML SC SOLN VIAL WRAPPER
45.0000 [IU] | Freq: Every evening | SUBCUTANEOUS | Status: DC
Start: 2023-06-24 — End: 2023-06-23

## 2023-06-23 MED ORDER — INSULIN LISPRO 100 UNIT/ML IJ SOLN
7.0000 [IU] | Freq: Three times a day (TID) | INTRAMUSCULAR | Status: DC
Start: 2023-06-24 — End: 2023-06-29
  Administered 2023-06-24 – 2023-06-29 (×18): 7 [IU] via SUBCUTANEOUS
  Filled 2023-06-23 (×17): qty 7

## 2023-06-23 MED ORDER — EMPAGLIFLOZIN 25 MG OR TABS
12.5000 mg | ORAL_TABLET | Freq: Every day | ORAL | Status: DC
Start: 2023-06-24 — End: 2023-06-24
  Administered 2023-06-24: 12.5 mg via ORAL
  Filled 2023-06-23: qty 0.5

## 2023-06-23 MED ORDER — CARBOXYMETHYLCELLULOSE SOD PF 0.5 % OP SOLN
2.0000 [drp] | OPHTHALMIC | Status: AC
Start: 2023-06-23 — End: 2023-06-24
  Administered 2023-06-23 (×3): 2 [drp] via OPHTHALMIC
  Filled 2023-06-23 (×3): qty 1

## 2023-06-23 MED ORDER — METFORMIN HCL ER 500 MG OR TB24
2000.0000 mg | EXTENDED_RELEASE_TABLET | Freq: Every day | ORAL | Status: DC
Start: 2023-07-06 — End: 2023-06-29

## 2023-06-23 MED ORDER — GLUCAGON HCL (DIAGNOSTIC) 1 MG IJ SOLR
1.0000 mg | INTRAMUSCULAR | Status: DC | PRN
Start: 2023-06-23 — End: 2023-06-29

## 2023-06-23 MED ORDER — EMPAGLIFLOZIN 25 MG OR TABS
25.0000 mg | ORAL_TABLET | Freq: Every day | ORAL | Status: DC
Start: 2023-06-23 — End: 2023-06-23
  Administered 2023-06-23: 12.5 mg via ORAL
  Filled 2023-06-23: qty 1

## 2023-06-23 MED ORDER — METFORMIN HCL ER 500 MG OR TB24
500.0000 mg | EXTENDED_RELEASE_TABLET | Freq: Every day | ORAL | Status: AC
Start: 2023-06-24 — End: 2023-06-27
  Administered 2023-06-24 – 2023-06-27 (×4): 500 mg via ORAL
  Filled 2023-06-23 (×4): qty 1

## 2023-06-23 MED ORDER — INSULIN NPH (HUMAN) (ISOPHANE) 100 UNIT/ML SC SUSP
10.0000 [IU] | Freq: Once | SUBCUTANEOUS | Status: AC
Start: 2023-06-24 — End: 2023-06-24
  Administered 2023-06-24: 10 [IU] via SUBCUTANEOUS
  Filled 2023-06-23: qty 10

## 2023-06-23 MED ORDER — METFORMIN HCL ER 500 MG OR TB24
1000.0000 mg | EXTENDED_RELEASE_TABLET | Freq: Every day | ORAL | Status: DC
Start: 2023-06-28 — End: 2023-06-29
  Administered 2023-06-28 – 2023-06-29 (×2): 1000 mg via ORAL
  Filled 2023-06-23 (×2): qty 2

## 2023-06-23 MED ORDER — INSULIN GLARGINE 100 UNIT/ML SC SOLN VIAL WRAPPER
20.0000 [IU] | Freq: Every evening | SUBCUTANEOUS | Status: DC
Start: 2023-06-24 — End: 2023-06-29
  Administered 2023-06-24 – 2023-06-28 (×5): 20 [IU] via SUBCUTANEOUS
  Filled 2023-06-23 (×5): qty 20

## 2023-06-23 MED ORDER — OXYCODONE HCL 5 MG OR TABS
10.0000 mg | ORAL_TABLET | ORAL | Status: DC | PRN
Start: 2023-06-23 — End: 2023-06-29
  Administered 2023-06-23 – 2023-06-28 (×41): 15 mg via ORAL
  Administered 2023-06-28: 10 mg via ORAL
  Administered 2023-06-29 (×5): 15 mg via ORAL
  Filled 2023-06-23 (×26): qty 3
  Filled 2023-06-23: qty 2
  Filled 2023-06-23 (×12): qty 3
  Filled 2023-06-23: qty 1
  Filled 2023-06-23 (×7): qty 3

## 2023-06-23 MED ORDER — DEXTROSE 50 % IV SOLN
50.0000 mL | INTRAVENOUS | Status: DC | PRN
Start: 2023-06-23 — End: 2023-06-29

## 2023-06-23 MED ORDER — INSULIN GLARGINE 100 UNIT/ML SC SOLN VIAL WRAPPER
45.0000 [IU] | Freq: Every evening | SUBCUTANEOUS | Status: DC
Start: 2023-06-23 — End: 2023-06-23

## 2023-06-23 MED ORDER — INSULIN GLARGINE 100 UNIT/ML SC SOLN VIAL WRAPPER
45.0000 [IU] | Freq: Once | SUBCUTANEOUS | Status: AC
Start: 2023-06-23 — End: 2023-06-23
  Administered 2023-06-23: 45 [IU] via SUBCUTANEOUS
  Filled 2023-06-23: qty 45

## 2023-06-23 MED ORDER — OXYCODONE HCL ER 30 MG OR T12A
30.0000 mg | EXTENDED_RELEASE_TABLET | Freq: Two times a day (BID) | ORAL | Status: DC
Start: 2023-06-23 — End: 2023-06-29
  Administered 2023-06-23 – 2023-06-29 (×12): 30 mg via ORAL
  Filled 2023-06-23 (×4): qty 1
  Filled 2023-06-23 (×2): qty 3
  Filled 2023-06-23 (×6): qty 1

## 2023-06-23 MED ORDER — METFORMIN HCL ER 500 MG OR TB24
1500.0000 mg | EXTENDED_RELEASE_TABLET | Freq: Every day | ORAL | Status: DC
Start: 2023-07-02 — End: 2023-06-29

## 2023-06-23 MED ORDER — INSULIN LISPRO 100 UNIT/ML IJ SOLN
0.0000 [IU] | Freq: Three times a day (TID) | INTRAMUSCULAR | Status: DC
Start: 2023-06-23 — End: 2023-06-29
  Administered 2023-06-23: 2 [IU] via SUBCUTANEOUS
  Administered 2023-06-24 – 2023-06-25 (×3): 1 [IU] via SUBCUTANEOUS
  Administered 2023-06-25: 2 [IU] via SUBCUTANEOUS
  Administered 2023-06-26 – 2023-06-29 (×10): 1 [IU] via SUBCUTANEOUS
  Filled 2023-06-23 (×3): qty 0
  Filled 2023-06-23: qty 3
  Filled 2023-06-23: qty 2
  Filled 2023-06-23 (×9): qty 0

## 2023-06-23 MED ORDER — INSULIN GLARGINE 100 UNIT/ML SC SOLN VIAL WRAPPER
45.0000 [IU] | Freq: Every morning | SUBCUTANEOUS | Status: DC
Start: 2023-06-24 — End: 2023-06-23

## 2023-06-23 MED ORDER — DEXTROSE 50 % IV SOLN
25.0000 mL | INTRAVENOUS | Status: DC | PRN
Start: 2023-06-23 — End: 2023-06-29
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
## 2023-06-23 NOTE — Progress Notes (Deleted)
Progress Note     RAGAN SLADKY [facility]") - DOB: [birth year] (57 year old male)  Pronouns: he/him/his  Admit Date: 06/14/2023  Code Status: Full Code       CHIEF CONCERN / IDENTIFICATION:     Carlos Hancock is a 57 year old male with ***     SUBJECTIVE   INTERVAL HISTORY:  Patient reports increase in short-acting oxy yesterday helped, however he still complains of pain. He has been refraining from using morphine IV as he would like to be on a PO plan to be closer to discharge. Used morphine IV 2mg  overnight.    Current Pain Complaints:  {location:114588}    Patient Reported Outcomes:   {patient reported outcomes:114580}    Treatment Provided at Time of Assessment:  {treatment provided:114605}    Patient Reported Treatment Related Side Effects:  {treatment related side effects:114602}       OBJECTIVE     {Vanishing Tip  Summary  Graph  Flowsheet  :999}   T: 36.8 C (06/23/23 0807)  BP: 122/69 (06/23/23 0807)  HR: (!) 108 (06/23/23 0807)  RR: 20 (06/23/23 0807)  SpO2: 97 % (06/23/23 0807) Room air  T range: Temp  Min: 36.4 C  Max: 36.8 C  Admit weight: 85.5 kg (188 lb 7.9 oz) (06/14/23 2149)  Last weight: 85.5 kg (188 lb 7.9 oz) (06/14/23 2149)       I&Os:   Intake/Output Summary (Last 24 hours) at 06/23/2023 1100  Last data filed at 06/23/2023 1037  Intake 2395 ml   Output 2700 ml   Net -305 ml       Physical Exam  ***  {Tubes/Catheters:114595}    {Strength and mobility (optional):114741}    Labs (last 24 hours): {Vanishing Tip  Labs Since Admission  Glycemic Control  :999}  Chemistries  CBC  LFT  Gases, other   135 100 14 234   -   AST: - ALT: -  -/-/-/-  -/-/-/-   4.9 29 0.75   - >< -  AP: - T bili: -  Lact (a): - Lact (v): -   eGFR: >60 Ca: 8.5   -   Prot: - Alb: -  Trop I: - D-dimer: -   Mg: 1.9 PO4: 3.3  ANC: -     BNP: - Anti-Xa: -     ALC: -    INR: -        {Data Review (.DATAREVIEWIP)  :114283}        ASSESSMENT/PLAN    Pain Care Impression: ***    Touching base with outpatient team to increase oxy ER  while inpatient               APS will continue to follow. Please do not make any changes to pain medication orders without discussing with our team. Please contact us with any additional questions or concerns.      {Vanishing Tip  In the "Insert SmartText" search bar , search for    Acute Pain Consult Note   [facility] IP ACUTE PAIN PROGRESS NOTE     Chronic Pain Consult Note   [facility] IP CHRONIC PAIN PROGRESS NOTE   :999}
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
## 2023-06-24 ENCOUNTER — Ambulatory Visit (HOSPITAL_COMMUNITY): Payer: Self-pay

## 2023-06-24 LAB — BASIC METABOLIC PANEL
Anion Gap: 8 (ref 4–12)
Calcium: 8.5 mg/dL — ABNORMAL LOW (ref 8.9–10.2)
Carbon Dioxide, Total: 27 meq/L (ref 22–32)
Chloride: 99 meq/L (ref 98–108)
Creatinine: 0.81 mg/dL (ref 0.51–1.18)
Glucose: 143 mg/dL — ABNORMAL HIGH (ref 62–125)
Potassium: 4.6 meq/L (ref 3.6–5.2)
Sodium: 134 meq/L — ABNORMAL LOW (ref 135–145)
Urea Nitrogen: 14 mg/dL (ref 8–21)
eGFR by CKD-EPI 2021: >60 mL/min/{1.73_m2} (ref >59)

## 2023-06-24 LAB — GLUCOSE POC, HMC
Glucose (POC): 145 mg/dL — ABNORMAL HIGH (ref 62–125)
Glucose (POC): 151 mg/dL — ABNORMAL HIGH (ref 62–125)
Glucose (POC): 129 mg/dL — ABNORMAL HIGH (ref 62–125)
Glucose (POC): 150 mg/dL — ABNORMAL HIGH (ref 62–125)

## 2023-06-24 LAB — PHOSPHATE: Phosphate: 4.4 mg/dL (ref 2.5–4.5)

## 2023-06-24 LAB — MAGNESIUM: Magnesium: 2 mg/dL (ref 1.8–2.4)

## 2023-06-24 MED ORDER — EMPAGLIFLOZIN 25 MG OR TABS
25.0000 mg | ORAL_TABLET | Freq: Every day | ORAL | Status: DC
Start: 2023-06-25 — End: 2023-06-28
  Administered 2023-06-25 – 2023-06-27 (×3): 25 mg via ORAL
  Filled 2023-06-24 (×4): qty 1

## 2023-06-24 MED ORDER — MORPHINE SULFATE (PF) 2 MG/ML IV/IJ SOLN WRAPPER
1.0000 mg | Freq: Every day | Status: DC | PRN
Start: 2023-06-24 — End: 2023-06-24

## 2023-06-24 MED ORDER — ARTIFICIAL TEARS OP SOLN
1.0000 [drp] | Freq: Three times a day (TID) | OPHTHALMIC | Status: DC
Start: 2023-06-24 — End: 2023-06-29
  Administered 2023-06-28 – 2023-06-29 (×2): 1 [drp] via OPHTHALMIC
  Filled 2023-06-24 (×3): qty 15

## 2023-06-24 MED ORDER — IBUPROFEN 400 MG OR TABS
600.0000 mg | ORAL_TABLET | Freq: Four times a day (QID) | ORAL | Status: DC
Start: 2023-06-24 — End: 2023-06-29
  Administered 2023-06-24 – 2023-06-29 (×21): 600 mg via ORAL
  Filled 2023-06-24 (×21): qty 1
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
## 2023-06-24 NOTE — Progress Notes (Signed)
Physical Therapy    Treatment attempt. Patient politely declined therapy participation today due to elevated pain level. Pt agreeable to participate later in day however unable re schedule due to time constraints. Acute PT will follow up in 1-2 days.
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
## 2023-06-25 LAB — GLUCOSE POC, HMC
Glucose (POC): 194 mg/dL — ABNORMAL HIGH (ref 62–125)
Glucose (POC): 162 mg/dL — ABNORMAL HIGH (ref 62–125)
Glucose (POC): 214 mg/dL — ABNORMAL HIGH (ref 62–125)
Glucose (POC): 171 mg/dL — ABNORMAL HIGH (ref 62–125)

## 2023-06-25 LAB — BASIC METABOLIC PANEL
Anion Gap: 7 (ref 4–12)
Calcium: 8.5 mg/dL — ABNORMAL LOW (ref 8.9–10.2)
Carbon Dioxide, Total: 28 meq/L (ref 22–32)
Chloride: 99 meq/L (ref 98–108)
Creatinine: 0.95 mg/dL (ref 0.51–1.18)
Glucose: 230 mg/dL — ABNORMAL HIGH (ref 62–125)
Potassium: 4.1 meq/L (ref 3.6–5.2)
Sodium: 134 meq/L — ABNORMAL LOW (ref 135–145)
Urea Nitrogen: 14 mg/dL (ref 8–21)
eGFR by CKD-EPI 2021: >60 mL/min/{1.73_m2} (ref >59)

## 2023-06-25 LAB — 1ST EXTRA LAVENDER TOP

## 2023-06-25 LAB — MAGNESIUM: Magnesium: 2 mg/dL (ref 1.8–2.4)

## 2023-06-25 LAB — PHOSPHATE: Phosphate: 4.1 mg/dL (ref 2.5–4.5)

## 2023-06-25 MED ORDER — OXYCODONE HCL 5 MG OR TABS
5.0000 mg | ORAL_TABLET | Freq: Every day | ORAL | Status: AC | PRN
Start: 2023-06-25 — End: 2023-06-27
  Administered 2023-06-27: 10 mg via ORAL
  Filled 2023-06-25: qty 2
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
## 2023-06-26 ENCOUNTER — Other Ambulatory Visit: Payer: Medicare HMO

## 2023-06-26 DIAGNOSIS — M25512 Pain in left shoulder: Secondary | ICD-10-CM

## 2023-06-26 DIAGNOSIS — Z0289 Encounter for other administrative examinations: Secondary | ICD-10-CM

## 2023-06-26 DIAGNOSIS — R079 Chest pain, unspecified: Secondary | ICD-10-CM

## 2023-06-26 LAB — EKG 12 LEAD
Atrial Rate: 113 {beats}/min
QTC Calculation: 471 ms
R Axis: 18 degrees
T Axis: 37 degrees
Ventricular Rate: 113 {beats}/min

## 2023-06-26 LAB — GLUCOSE POC, HMC
Glucose (POC): 141 mg/dL — ABNORMAL HIGH (ref 62–125)
Glucose (POC): 166 mg/dL — ABNORMAL HIGH (ref 62–125)
Glucose (POC): 182 mg/dL — ABNORMAL HIGH (ref 62–125)
Glucose (POC): 153 mg/dL — ABNORMAL HIGH (ref 62–125)

## 2023-06-26 MED ORDER — HYDROMORPHONE HCL 1 MG/ML IJ SOLN
INTRAMUSCULAR | Status: AC
Start: 2023-06-26 — End: 2023-06-26
  Administered 2023-06-26: 1 mg via INTRAVENOUS
  Filled 2023-06-26: qty 1

## 2023-06-26 MED ORDER — HYDROMORPHONE HCL 1 MG/ML IJ SOLN
0.2000 mg | Freq: Once | INTRAMUSCULAR | Status: DC | PRN
Start: 2023-06-26 — End: 2023-06-26

## 2023-06-26 MED ORDER — HYDROMORPHONE HCL 1 MG/ML IJ SOLN
1.0000 mg | Freq: Once | INTRAMUSCULAR | Status: AC
Start: 2023-06-26 — End: 2023-06-26
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
## 2023-06-26 NOTE — Significant Event (Addendum)
Rapid Response Note - Provider     Lyanne Co [name]") - DOB: [birth year] (57 year old male)  Pronouns: he/him/his  Admit Date: 06/14/2023  Code Status: Full Code        Brief summary of event:   RRT for chest pain, unable to catch his breath.     Pertinent vitals:  Patient Vitals for the past 24 hrs:   BP Temp Temp src Pulse Resp SpO2   06/26/23 0157 (!) 143/88 -- -- (!) 113 20 99 %   06/26/23 0026 (!) 180/103 36.2 C Temporal (!) 115 18 99 %   06/25/23 0802 (!) 144/78 36.2 C Temporal (!) 116 18 95 %          Physical Exam  General: awake, alert, distressed  Head: NCAT  ENT: sclera anicteric, MMM  Chest: breathing unlabored on room air  CV: all extremities warm and well-perfused  Neuro: unable to talk secondary to pain     Pertinent labs:   Labs (last 24 hours):   Chemistries  CBC  LFT  Gases, other   134 99 14 171   -   AST: - ALT: -  -/-/-/-  -/-/-/-   4.1 28 0.95   - >< -  AP: - T bili: -  Lact (a): - Lact (v): -   eGFR: >60 Ca: 8.5   -   Prot: - Alb: -  Trop I: - D-dimer: -   Mg: 2.0 PO4: 4.1  ANC: -     BNP: - Anti-Xa: -     ALC: -    INR: -           Assessment/Plan:   #Acute chest pain   Likely musculoskeletal in the setting of rib fx,  - ECG with no ischemia, Other VSS except for tachy and hypertension in the setting fo severe pain, no diaphoresis  - improved with a dose of IV dilaudid   - APS to follow and adjust pain meds.      Dispo: Acute care  Discussed with: STAT and bedside RN and Pain medicine resident    Medical decision making to support the patient and to treat the above problem is of high complexity.  Critical care time spent 35 minutes, including personally evaluating the patient, communicating with RN, reviewing charts, putting orders, formulating a detailed treatment plan, following up and coordinating care, exclusive of all other separately billed services.      Katherine Mantle, MD  Internal Medicine Attending   [facility]  General Internal Medicine  Hospital Medicine  P:  [phone]
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
## 2023-06-27 ENCOUNTER — Inpatient Hospital Stay (HOSPITAL_COMMUNITY): Payer: Medicare HMO

## 2023-06-27 DIAGNOSIS — S280XXA Crushed chest, initial encounter: Secondary | ICD-10-CM

## 2023-06-27 LAB — GLUCOSE POC, HMC
Glucose (POC): 165 mg/dL — ABNORMAL HIGH (ref 62–125)
Glucose (POC): 161 mg/dL — ABNORMAL HIGH (ref 62–125)
Glucose (POC): 153 mg/dL — ABNORMAL HIGH (ref 62–125)
Glucose (POC): 145 mg/dL — ABNORMAL HIGH (ref 62–125)

## 2023-06-27 MED ORDER — HYDROMORPHONE HCL 1 MG/ML IJ SOLN
0.2000 mg | Freq: Once | INTRAMUSCULAR | Status: AC | PRN
Start: 2023-06-27 — End: 2023-06-27
  Administered 2023-06-27: 0.4 mg via INTRAVENOUS
  Filled 2023-06-27: qty 1

## 2023-06-27 NOTE — Progress Notes (Signed)
RFP    Pt on RA. BBS clear diminished. Pt does IS 1500 in pain
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
## 2023-06-28 DIAGNOSIS — M79602 Pain in left arm: Secondary | ICD-10-CM

## 2023-06-28 DIAGNOSIS — R0789 Other chest pain: Secondary | ICD-10-CM

## 2023-06-28 DIAGNOSIS — S2243XA Multiple fractures of ribs, bilateral, initial encounter for closed fracture: Secondary | ICD-10-CM | POA: Diagnosis present

## 2023-06-28 DIAGNOSIS — J939 Pneumothorax, unspecified: Secondary | ICD-10-CM | POA: Diagnosis present

## 2023-06-28 DIAGNOSIS — S2232XA Fracture of one rib, left side, initial encounter for closed fracture: Secondary | ICD-10-CM

## 2023-06-28 DIAGNOSIS — S2231XA Fracture of one rib, right side, initial encounter for closed fracture: Secondary | ICD-10-CM

## 2023-06-28 DIAGNOSIS — E119 Type 2 diabetes mellitus without complications: Secondary | ICD-10-CM | POA: Diagnosis present

## 2023-06-28 DIAGNOSIS — G8918 Other acute postprocedural pain: Secondary | ICD-10-CM

## 2023-06-28 DIAGNOSIS — F1190 Opioid use, unspecified, uncomplicated: Secondary | ICD-10-CM | POA: Diagnosis present

## 2023-06-28 LAB — GLUCOSE POC, HMC
Glucose (POC): 171 mg/dL — ABNORMAL HIGH (ref 62–125)
Glucose (POC): 178 mg/dL — ABNORMAL HIGH (ref 62–125)
Glucose (POC): 154 mg/dL — ABNORMAL HIGH (ref 62–125)
Glucose (POC): 194 mg/dL — ABNORMAL HIGH (ref 62–125)

## 2023-06-28 MED ORDER — EMPAGLIFLOZIN 25 MG OR TABS
12.5000 mg | ORAL_TABLET | Freq: Every day | ORAL | Status: DC
Start: 2023-06-28 — End: 2023-06-29
  Administered 2023-06-28 – 2023-06-29 (×2): 12.5 mg via ORAL
  Filled 2023-06-28 (×3): qty 0.5

## 2023-06-28 MED ORDER — GABAPENTIN 300 MG OR CAPS
900.0000 mg | ORAL_CAPSULE | Freq: Three times a day (TID) | ORAL | Status: DC
Start: 2023-06-28 — End: 2023-06-29
  Administered 2023-06-28 – 2023-06-29 (×3): 900 mg via ORAL
  Filled 2023-06-28 (×3): qty 3

## 2023-06-28 NOTE — Discharge Instr - Other Info (Signed)
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 Dollene Primrose /CAT Discharge Scheduling - 06/29/2023; 5:15 PM]  ANY CHANGES TO FOLLOW UPS, please update Follow-Up Appointments section and [facility] milestone for reprocessing    ANY CHANGES TO DISPOSITION, please CALL [phone] or [facility] milestone for reprocessing
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
## 2023-06-28 NOTE — Progress Notes (Signed)
RFP:  G- [id number], A- [id number]    Shift Note:    Patient found on RA sleeping with spO2 98%. Able to do IS to . PT has a strong, NPC. Does endorse pain during treatments.
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
## 2023-06-29 ENCOUNTER — Other Ambulatory Visit (HOSPITAL_BASED_OUTPATIENT_CLINIC_OR_DEPARTMENT_OTHER): Payer: Self-pay

## 2023-06-29 LAB — GLUCOSE POC, HMC
Glucose (POC): 155 mg/dL — ABNORMAL HIGH (ref 62–125)
Glucose (POC): 170 mg/dL — ABNORMAL HIGH (ref 62–125)
Glucose (POC): 150 mg/dL — ABNORMAL HIGH (ref 62–125)

## 2023-06-29 LAB — EKG 12 LEAD
P Axis: 53 degrees
P-R Interval: 150 ms
Q-T Interval: 344 ms
QRS Duration: 112 ms

## 2023-06-29 MED ORDER — METHOCARBAMOL 500 MG OR TABS
1000.0000 mg | ORAL_TABLET | Freq: Four times a day (QID) | ORAL | 0 refills | Status: AC | PRN
Start: 2023-06-29 — End: ?
  Filled 2023-06-29: qty 112, 14d supply, fill #0

## 2023-06-29 MED ORDER — OXYCODONE HCL ER 15 MG OR T12A
15.0000 mg | EXTENDED_RELEASE_TABLET | Freq: Two times a day (BID) | ORAL | Status: DC
Start: 2023-07-13 — End: 2023-06-29

## 2023-06-29 MED ORDER — OXYCODONE HCL ER 10 MG OR T12A
10.0000 mg | EXTENDED_RELEASE_TABLET | Freq: Two times a day (BID) | ORAL | Status: DC
Start: 2023-07-20 — End: 2023-06-29

## 2023-06-29 MED ORDER — MELATONIN 3 MG OR TABS
6.0000 mg | ORAL_TABLET | Freq: Every evening | ORAL | 0 refills | Status: AC | PRN
Start: 2023-06-29 — End: ?
  Filled 2023-06-29: qty 30, 15d supply, fill #0

## 2023-06-29 MED ORDER — ASCORBIC ACID 500 MG OR TABS
500.0000 mg | ORAL_TABLET | Freq: Every day | ORAL | 0 refills | Status: AC
Start: 2023-06-30 — End: ?
  Filled 2023-06-29: qty 30, 30d supply, fill #0

## 2023-06-29 MED ORDER — ARTIFICIAL TEARS OP SOLN
1.0000 [drp] | Freq: Three times a day (TID) | OPHTHALMIC | 0 refills | Status: AC
Start: 2023-06-29 — End: ?
  Filled 2023-06-29: qty 15, 100d supply, fill #0

## 2023-06-29 MED ORDER — ACETAMINOPHEN 500 MG OR TABS
1000.0000 mg | ORAL_TABLET | Freq: Four times a day (QID) | ORAL | 0 refills | Status: AC
Start: 2023-06-29 — End: ?
  Filled 2023-06-29: qty 100, 13d supply, fill #0

## 2023-06-29 MED ORDER — OXYCODONE HCL ER 10 MG OR T12A
10.0000 mg | EXTENDED_RELEASE_TABLET | Freq: Two times a day (BID) | ORAL | 0 refills | Status: AC
Start: 2023-06-29 — End: 2023-07-06
  Filled 2023-06-29: qty 14, 7d supply, fill #0

## 2023-06-29 MED ORDER — IBUPROFEN 600 MG OR TABS
600.0000 mg | ORAL_TABLET | Freq: Four times a day (QID) | ORAL | Status: AC | PRN
Start: 2023-06-29 — End: ?

## 2023-06-29 MED ORDER — MUSCLE RUB 10-15 % EX CREA
TOPICAL_CREAM | Freq: Two times a day (BID) | CUTANEOUS | 0 refills | Status: AC | PRN
Start: 2023-06-29 — End: ?
  Filled 2023-06-29: qty 85, fill #0

## 2023-06-29 MED ORDER — POLYETHYLENE GLYCOL 3350 17 G OR PACK
17.0000 g | PACK | Freq: Every day | ORAL | 0 refills | Status: AC
Start: 2023-06-30 — End: ?
  Filled 2023-06-29: qty 14, 14d supply, fill #0

## 2023-06-29 MED ORDER — DICLOFENAC SODIUM 1 % EX GEL
2.0000 g | Freq: Four times a day (QID) | CUTANEOUS | 0 refills | Status: AC | PRN
Start: 2023-06-29 — End: ?
  Filled 2023-06-29: qty 50, 7d supply, fill #0

## 2023-06-29 MED ORDER — OXYCODONE HCL ER 15 MG OR T12A
15.0000 mg | EXTENDED_RELEASE_TABLET | Freq: Two times a day (BID) | ORAL | 0 refills | Status: AC
Start: 2023-06-29 — End: 2023-07-06
  Filled 2023-06-29: qty 14, 7d supply, fill #0

## 2023-06-29 MED ORDER — SENNOSIDES 8.6 MG OR TABS
17.2000 mg | ORAL_TABLET | Freq: Two times a day (BID) | ORAL | 0 refills | Status: AC
Start: 2023-06-29 — End: ?
  Filled 2023-06-29: qty 56, 14d supply, fill #0

## 2023-06-29 MED ORDER — NALOXONE HCL 4 MG/0.1ML NA LIQD
NASAL | 0 refills | Status: AC
Start: 2023-06-29 — End: 2024-06-28
  Filled 2023-06-29: qty 2, 1d supply, fill #0

## 2023-06-29 MED ORDER — GABAPENTIN 300 MG OR CAPS
ORAL_CAPSULE | ORAL | 0 refills | Status: AC
Start: 2023-06-29 — End: 2023-07-20
  Filled 2023-06-29: qty 126, 21d supply, fill #0

## 2023-06-29 MED ORDER — OXYCODONE HCL ER 20 MG OR T12A
20.0000 mg | EXTENDED_RELEASE_TABLET | Freq: Two times a day (BID) | ORAL | Status: DC
Start: 2023-07-06 — End: 2023-06-29

## 2023-06-29 MED ORDER — OXYCODONE HCL 10 MG OR TABS
10.0000 mg | ORAL_TABLET | ORAL | 0 refills | Status: AC | PRN
Start: 2023-06-29 — End: 2023-07-06
  Filled 2023-06-29: qty 84, 7d supply, fill #0

## 2023-06-29 MED ORDER — OXYCODONE HCL ER 15 MG OR T12A
25.0000 mg | EXTENDED_RELEASE_TABLET | Freq: Two times a day (BID) | ORAL | Status: DC
Start: 2023-06-29 — End: 2023-06-29

## 2023-06-29 MED ORDER — CALCIUM CARBONATE ANTACID 500 MG OR CHEW
1000.0000 mg | CHEWABLE_TABLET | Freq: Three times a day (TID) | ORAL | Status: AC | PRN
Start: 2023-06-29 — End: ?

## 2023-06-29 NOTE — Progress Notes (Signed)
RFP:  G- [phone], A- [id number]    Shift Note:    Patient able to do IS to . Plans are to be discharged @ 1800.
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
## 2023-06-29 NOTE — Progress Notes (Addendum)
CCN Progress Note    Continuity of Care Nurse met with patient to discuss care coordination needs during and after this hospital stay. Patient preferences, availability of medical services close to home and insurance network options for home health, home equipment/supplies, medications and follow-up are all included in transition plans.    Visit Information  Admission diagnosis: Crushed chest  Inpatient admission date: 06/14/2023, 06/14/2023    Primary Insurance Coverage  [facility] Medicare (group [employer] Subscriber [place]  [id number]    Discharge Complexity Level  1-Simple    Living situation post discharge: Home with family/support     06/29/23 Update:     Pt is currently working with PT for stair training , Progress Dc home with family support       CCN sent [facility] referral paperwork to [facility] around 12pm,  [facility]  They have a new fax number for home health referrals. It is [phone]. , [facility] (  PT/OT,)number referral intake office ([facility]) [phone]     Facto face, PT/OT notes, Order faxed to [facility]              Patient expects to be discharged to: home       Post-discharge facility and service information was:                  Discharge Plan   Anticipated Living Situation Post Discharge/Needs:  Pt's goal is to get back home  Barriers to Discharge:  Potential barriers to Discharge: Mobility status not sufficient for discharge     Anticipated Discharge Date:  07/03/2023
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
## 2023-06-29 NOTE — Discharge Summary (Signed)
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 LCTAB no w/r/r   Abd: soft, NT/ND  EXT: MAE   Skin: warm and dry           [facility] physicians mentioned in this note can be reached by calling the [facility] Medicine Paging Operator at [phone]. If any part of this transcript is missing or to request other transcripts for this patient call [phone]. For online access to patient records enroll in [facility] at [email or web address].
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
## 2023-06-30 ENCOUNTER — Other Ambulatory Visit (HOSPITAL_BASED_OUTPATIENT_CLINIC_OR_DEPARTMENT_OTHER): Payer: Self-pay

## 2023-07-15 ENCOUNTER — Encounter (HOSPITAL_COMMUNITY): Payer: Self-pay | Admitting: Anesthesiology

## 2023-09-02 ENCOUNTER — Emergency Department: Payer: Self-pay

## 2023-09-02 LAB — COMPREHENSIVE METABOLIC PANEL
ALT (SGPT): 17 U/L (ref 7–52)
AST (SGOT), External: 11 U/L — ABNORMAL LOW (ref 13–39)
Albumin, External: 4.3 g/dL (ref 3.5–5.7)
Alkaline Phosphatase, Phosphate: 94 U/L (ref 37–105)
Anion Gap, External: 8 mmol/L (ref 4–11)
BUN: 18 mg/dL (ref 7.0–25.0)
Bilirubin Total, External: 0.4 mg/dL (ref 0.3–1.2)
CO2, External: 27 mmol/L (ref 21–31)
Calcium, External: 9.5 mg/dL (ref 8.6–10.3)
Chloride, External: 103 mmol/L (ref 98–107)
Creatinine, External: 0.98 mg/dL (ref 0.70–1.30)
Glucose, External: 205 mg/dL — ABNORMAL HIGH (ref 70–109)
Potassium, External: 4.1 mmol/L (ref 3.5–5.1)
Sodium, External: 138 mmol/L (ref 136–145)
Total Protein, External: 7.3 g/dL (ref 6.0–8.3)
eGFR, External: >=90 mL/min/{1.73_m2} (ref >60)

## 2024-01-13 NOTE — Progress Notes (Signed)
 Carlos Hancock, DA    Dental procedures in this visit   . [id number] - P PERIODIC ORAL EVALUATION - ESTABLISHED PATIENT (Completed)     Service provider: Jaynee Eagles, DMD     Billing provider: Jaynee Eagles, DMD   . Carlos Hancock - P INTRAORAL - PERIAPICAL FIRST RADIOGRAPHIC IMAGE (Completed)     Service provider: Jaynee Eagles, DMD     Billing provider: Jaynee Eagles, DMD   . Carlos Hancock - P INTRAORAL - PERIAPICAL EACH ADDITIONAL RADIOGRAPHIC IMAGE (Completed)     Service provider: Jaynee Eagles, DMD     Billing provider: Jaynee Eagles, DMD   . Carlos Hancock - P INTRAORAL - PERIAPICAL EACH ADDITIONAL RADIOGRAPHIC IMAGE (Completed)     Service provider: Jaynee Eagles, DMD     Billing provider: Jaynee Eagles, DMD   . Carlos Hancock - P INTRAORAL - PERIAPICAL EACH ADDITIONAL RADIOGRAPHIC IMAGE (Completed)     Service provider: Jaynee Eagles, DMD     Billing provider: Jaynee Eagles, DMD   . Carlos Hancock - P INTRAORAL - PERIAPICAL EACH ADDITIONAL RADIOGRAPHIC IMAGE (Completed)     Service provider: Jaynee Eagles, DMD     Billing provider: Jaynee Eagles, DMD   . Carlos Hancock - P INTRAORAL - PERIAPICAL EACH ADDITIONAL RADIOGRAPHIC IMAGE (Completed)     Service provider: Jaynee Eagles, DMD     Billing provider: Jaynee Eagles, DMD   . Carlos Hancock - P INTRAORAL - PERIAPICAL EACH ADDITIONAL RADIOGRAPHIC IMAGE (Completed)     Service provider: Jaynee Eagles, DMD     Billing provider: Jaynee Eagles, DMD   . Carlos Hancock - P INTRAORAL - PERIAPICAL EACH ADDITIONAL RADIOGRAPHIC IMAGE (Completed)     Service provider: Jaynee Eagles, DMD     Billing provider: Jaynee Eagles, DMD   . Carlos Hancock - P BITEWINGS - THREE RADIOGRAPHIC IMAGES (Completed)     Service provider: Jaynee Eagles, DMD     Billing provider: Jaynee Eagles, DMD   . [phone] - P CARIES RISK ASSESSMENT AND DOCUMENTATION, WITH A FINDING OF HIGH RISK (Completed)     Service provider: Jaynee Eagles, DMD     Billing provider: Jaynee Eagles, DMD     Subjective  Patient ID: Carlos Hancock is a 58  year old male.    Current Blood Pressure: BP: 131/78  Current Pain Score: Pain Score: 0 - No pain    Description/Today's appointment:  Patient presents for Comprehensive Oral Evaluation and Treatment Plan.  (A visit for dental exam and health care maintenance)    P: Chief concern: LR area, something causing discomfort he wanted to make sure nothing is there since he had tooth extracted in the past from LR side    A: Health History review: Health History review was completed today.    Oral Cancer Screening questions;  Smoking history: Patient denies   Vaping or other Smokeless Tobacco history: Patient denies   Alcohol history: Patient denies     Existing radiographs: yes  Radiographs taken and/or reviewed today;    X-rays taken today:     8 Periapicals (teeth),  3 Bitewing(s)  X-rays charged today:  8 Periapicals (teeth),  3 Bitewing(s)     OHI habits (brushing and flossing habits): TB 2x/day, not flossing regularly    Parafunctional habits: Bruxism signs    Clinical Findings/Oral Exam Findings/Diagnosis: see detailed notes below and see detailed treatment plan for other findings/conditions and for  recommended treatment plan;    `Extra oral Exam;   Patient reports History of TMJ pain: none   TMJ pain upon opening: none   HX of Pain/Trauma/Locking/Dislocation: none   Lymphadenopathy/Swelling: none    `IO Exam Soft Tissues;  Tongue - WNL -  - Dr. Delice Bison, no white lesions seen as of today, pt. Also saw Oral surgeon then and was fine he said he may be biting his tongue then,   soft palate, hard palate, mucosa, floor of the mouth - Intraoral findings are WNL  Oral cancer Screening: Negative    `Radiographic Interpretation:   Evidence of carious lesions (Also see Tooth Chart for surface details):  yes caries into dentin, root caries   No root tip seen as of today onLR side, informed pt. That if he feels that quick sharp pain/discomfort he can let me know and I would recommend to refer to Orofacial pain specialist, he said  that it has been on-going for a while    `Periodontal Findings;  Calculus:   Slight  Plaque: moderate  Stains:  slight  Bleeding: yes moderate  Gingival Inflammation:  moderate  PSR (specify): [id number]  Radiographic evidence of Bone Loss:  Gen mild w/moderate on lower anteriors  Periodontal Diagnosis: Generalized. Mild periodontitis    `Intraoral Survey;    Dr. Recommended to consult for prostho treatment plan before restoring upper arch, pt, was advised best to treat with full coverage crowns his upper anteriors #6-11 but if finances are of concern he will let me know what they recommend at private office, then if he will keep them (not extract and not get full  upper CD yet), we can restore with resin     Caries Risk:  Moderate Risk (localized incipient caries and/or decalcifications)    R: Medications Prescribe: None      T: Today's visit:  Periodic oral evaluation and treatment plan.  Risks, benefits and alternatives treatment options were presented to patient.   Periodic examination, Radiographs, Periodontal probings, Patient Education, Oral Hygiene Instructions.  We explained radiographic, clinical findings, diagnosis to the patient.  Risks, benefits and alternatives treatment options were presented to patient.  We explained all the treatment options and treatment plan with the patient (See Notes below for treatment recommended/Treatment Plan);     `Goals of Treatment Plan:   Urgent treatment need/Treatment for acute problem: none today  Periodontal treatment: Periodontal Maintenance   Restorative treatment (Fillings) -  yes and TBD after prostho tx plan  Definitive treatment / Referrals  - Referred today for prostho consult - restorability  Periodontal maintenance every 3-4 months  Periodic exams recommended every 6 months      Patient understood and agreed. Patient chooses to follow recommended TX plan.  The patient was educated in oral hygiene instructions, and caries.  We confirmed that all questions  answered, patient left in good condition.   Patient appeared satisfied for today's procedure.     S: Perio maintenance + PC   Restos-- PRN - will wait for prostho tx plan  6-mo Recall-visual due 07/13/2024    Open Referral/s: [facility] - for prostho treatment plan  For re-evaluation of restorability of upper arch remaining teeth, I advised pt. That #6,7,8,9, 10,11 are best restored with full coverage, but finances may be a concern. I advised pt. That I would like for patient to consult for upper RPD if possible then if let us know if it is a good idea to do restore with resin  restorations.  Referring for treatment recommendations for upper and lower arch, also if they recommend to treat #14 or extract #14. If RPD/RPD is what you would recommend.

## 9659-03-23 DEATH — deceased
